# Patient Record
Sex: Female | Born: 1970 | Race: White | Hispanic: No | Marital: Married | Smoking: Never smoker
Health system: Southern US, Community
[De-identification: ages and names within clinical notes are randomized; demographics above are authoritative.]

## PROBLEM LIST (undated history)

## (undated) DIAGNOSIS — E079 Disorder of thyroid, unspecified: Secondary | ICD-10-CM

## (undated) DIAGNOSIS — N950 Postmenopausal bleeding: Secondary | ICD-10-CM

## (undated) DIAGNOSIS — E039 Hypothyroidism, unspecified: Secondary | ICD-10-CM

## (undated) DIAGNOSIS — E282 Polycystic ovarian syndrome: Secondary | ICD-10-CM

## (undated) DIAGNOSIS — F419 Anxiety disorder, unspecified: Secondary | ICD-10-CM

## (undated) DIAGNOSIS — T7840XA Allergy, unspecified, initial encounter: Secondary | ICD-10-CM

## (undated) DIAGNOSIS — Z5189 Encounter for other specified aftercare: Secondary | ICD-10-CM

## (undated) DIAGNOSIS — Z973 Presence of spectacles and contact lenses: Secondary | ICD-10-CM

## (undated) DIAGNOSIS — K589 Irritable bowel syndrome without diarrhea: Secondary | ICD-10-CM

## (undated) DIAGNOSIS — N840 Polyp of corpus uteri: Secondary | ICD-10-CM

## (undated) DIAGNOSIS — M1990 Unspecified osteoarthritis, unspecified site: Secondary | ICD-10-CM

## (undated) HISTORY — DX: Anxiety disorder, unspecified: F41.9

## (undated) HISTORY — PX: WISDOM TOOTH EXTRACTION: SHX21

## (undated) HISTORY — DX: Disorder of thyroid, unspecified: E07.9

## (undated) HISTORY — DX: Irritable bowel syndrome, unspecified: K58.9

## (undated) HISTORY — DX: Allergy, unspecified, initial encounter: T78.40XA

## (undated) HISTORY — DX: Encounter for other specified aftercare: Z51.89

## (undated) HISTORY — PX: LIPOMA EXCISION: SHX5283

---

## 2002-05-15 ENCOUNTER — Other Ambulatory Visit: Admission: RE | Admit: 2002-05-15 | Discharge: 2002-05-15 | Payer: Self-pay

## 2002-05-15 ENCOUNTER — Other Ambulatory Visit: Admission: RE | Admit: 2002-05-15 | Discharge: 2002-05-15 | Payer: Self-pay | Admitting: Unknown Physician Specialty

## 2003-11-21 DIAGNOSIS — Z5189 Encounter for other specified aftercare: Secondary | ICD-10-CM

## 2003-11-21 HISTORY — PX: DILATION AND CURETTAGE OF UTERUS: SHX78

## 2003-11-21 HISTORY — DX: Encounter for other specified aftercare: Z51.89

## 2007-01-22 ENCOUNTER — Ambulatory Visit: Payer: Self-pay | Admitting: Internal Medicine

## 2007-02-04 ENCOUNTER — Ambulatory Visit: Payer: Self-pay | Admitting: Internal Medicine

## 2007-02-04 HISTORY — PX: COLONOSCOPY: SHX174

## 2014-06-15 ENCOUNTER — Encounter: Payer: Self-pay | Admitting: Internal Medicine

## 2015-09-27 ENCOUNTER — Other Ambulatory Visit: Payer: Self-pay

## 2015-09-27 DIAGNOSIS — Z1231 Encounter for screening mammogram for malignant neoplasm of breast: Secondary | ICD-10-CM

## 2015-11-01 ENCOUNTER — Ambulatory Visit: Payer: Self-pay

## 2017-01-09 ENCOUNTER — Other Ambulatory Visit: Payer: Self-pay | Admitting: Obstetrics and Gynecology

## 2017-01-09 DIAGNOSIS — R928 Other abnormal and inconclusive findings on diagnostic imaging of breast: Secondary | ICD-10-CM

## 2017-01-12 ENCOUNTER — Ambulatory Visit
Admission: RE | Admit: 2017-01-12 | Discharge: 2017-01-12 | Disposition: A | Discharge status: Home / Self Care | Payer: BLUE CROSS/BLUE SHIELD | Source: Ambulatory Visit | Attending: Obstetrics and Gynecology | Admitting: Obstetrics and Gynecology

## 2017-01-12 ENCOUNTER — Other Ambulatory Visit: Payer: Self-pay | Admitting: Obstetrics and Gynecology

## 2017-01-12 DIAGNOSIS — R928 Other abnormal and inconclusive findings on diagnostic imaging of breast: Secondary | ICD-10-CM

## 2017-01-12 DIAGNOSIS — N6310 Unspecified lump in the right breast, unspecified quadrant: Secondary | ICD-10-CM

## 2017-01-15 ENCOUNTER — Ambulatory Visit
Admission: RE | Admit: 2017-01-15 | Discharge: 2017-01-15 | Disposition: A | Discharge status: Home / Self Care | Payer: BLUE CROSS/BLUE SHIELD | Source: Ambulatory Visit | Attending: Obstetrics and Gynecology | Admitting: Obstetrics and Gynecology

## 2017-01-15 ENCOUNTER — Other Ambulatory Visit: Payer: Self-pay | Admitting: Obstetrics and Gynecology

## 2017-01-15 DIAGNOSIS — N6310 Unspecified lump in the right breast, unspecified quadrant: Secondary | ICD-10-CM

## 2018-04-05 IMAGING — US ULTRASOUND RIGHT BREAST LIMITED
1 series · 7 of 7 positions shown · non-contrast
Comparison: Previous exam(s).

CLINICAL DATA: Right breast upper outer quadrant nodule seen on
most recent screening mammography.

EXAM:
2D DIGITAL DIAGNOSTIC RIGHT MAMMOGRAM WITH CAD AND ADJUNCT TOMO
ULTRASOUND RIGHT BREAST

[Series 1: ultrasound right breast limited · 0.06mm/px · 7 of 7 slices shown]
[im 1/7]
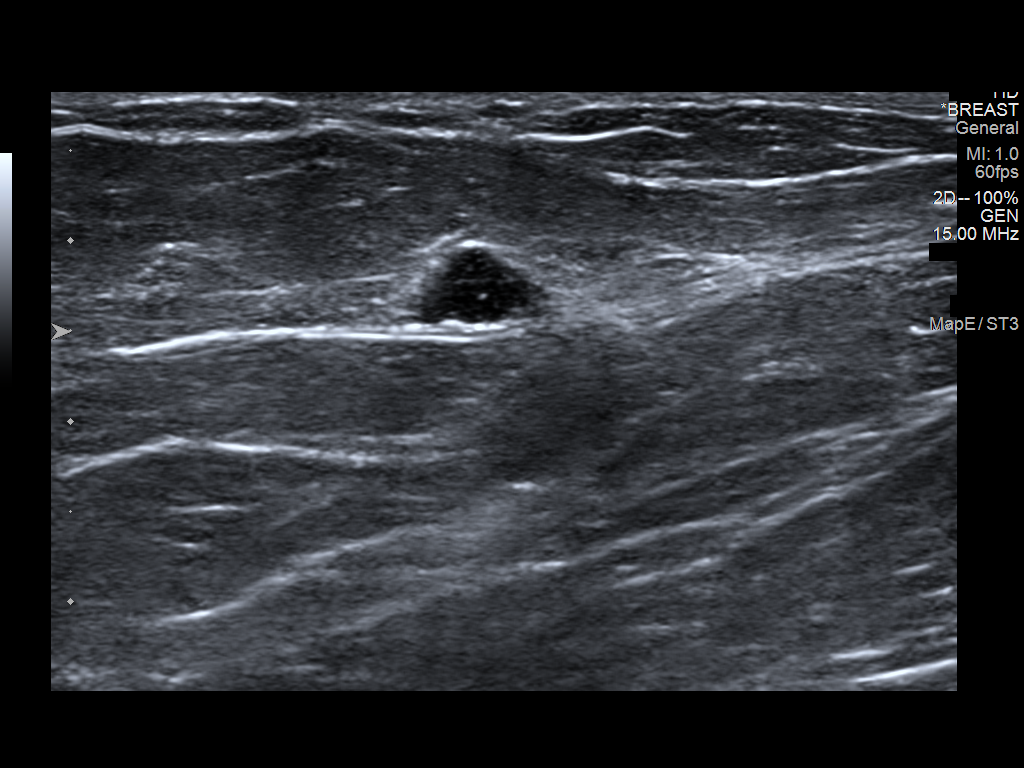
[im 2/7]
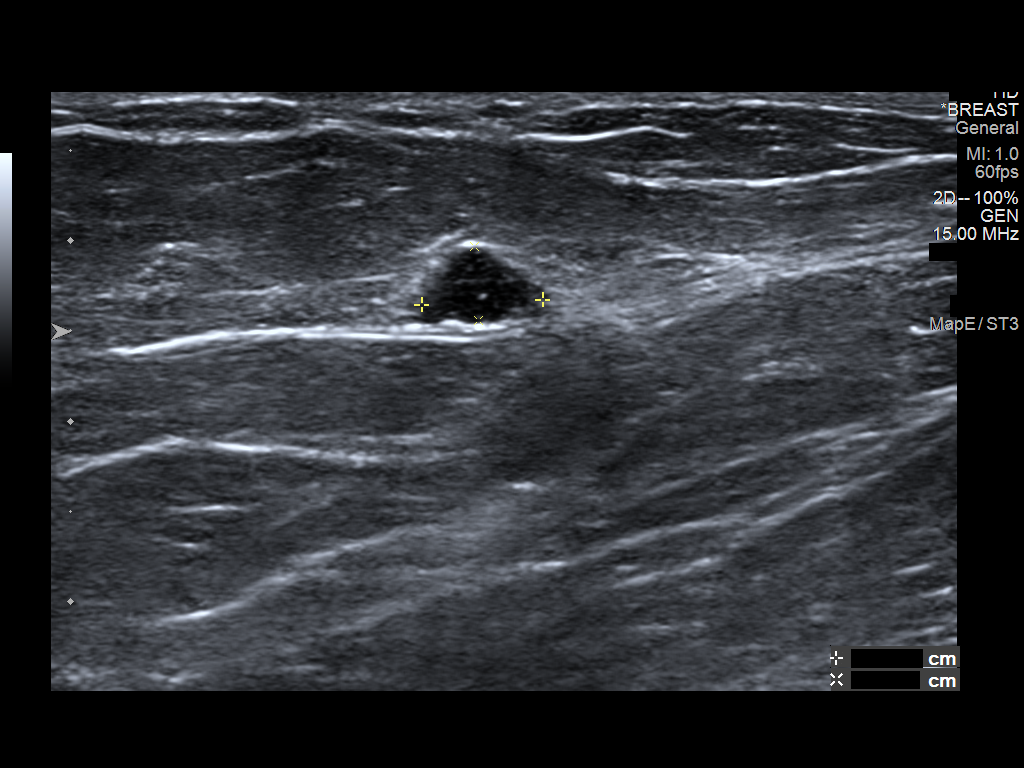
[im 3/7]
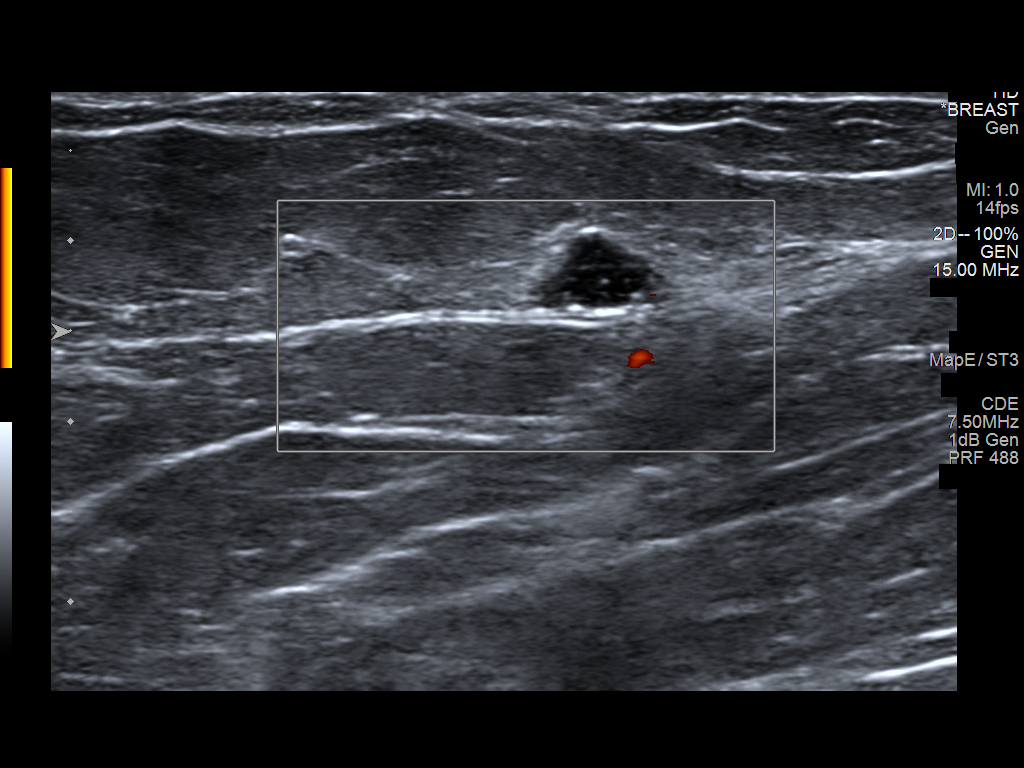
[im 4/7]
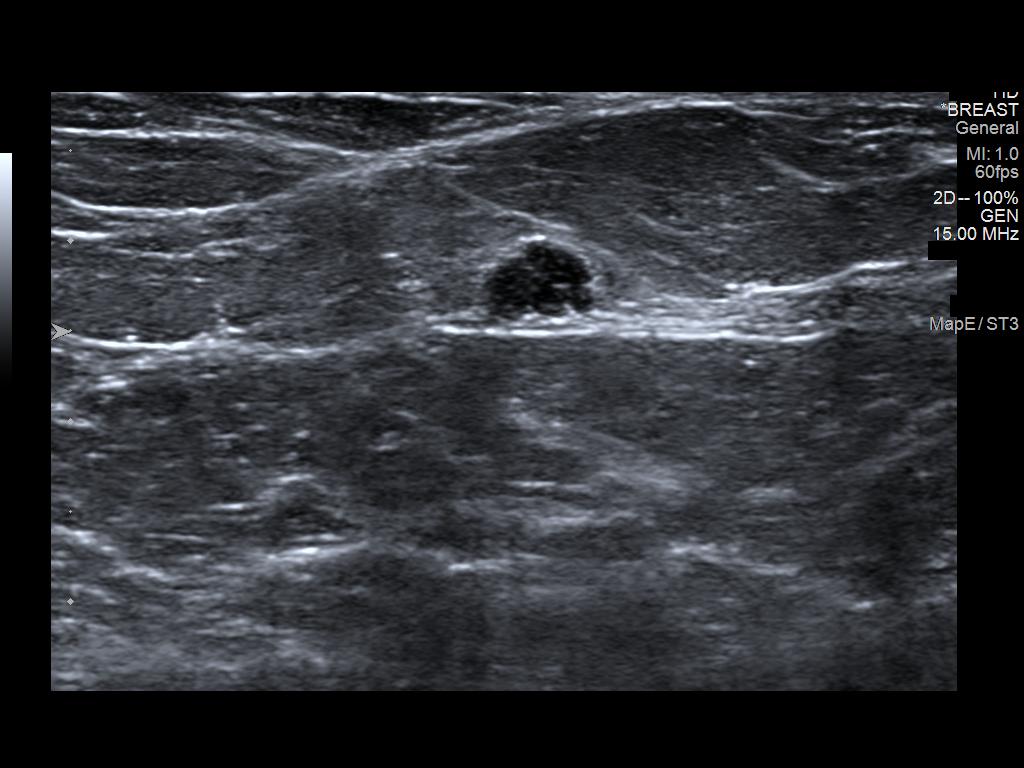
[im 5/7]
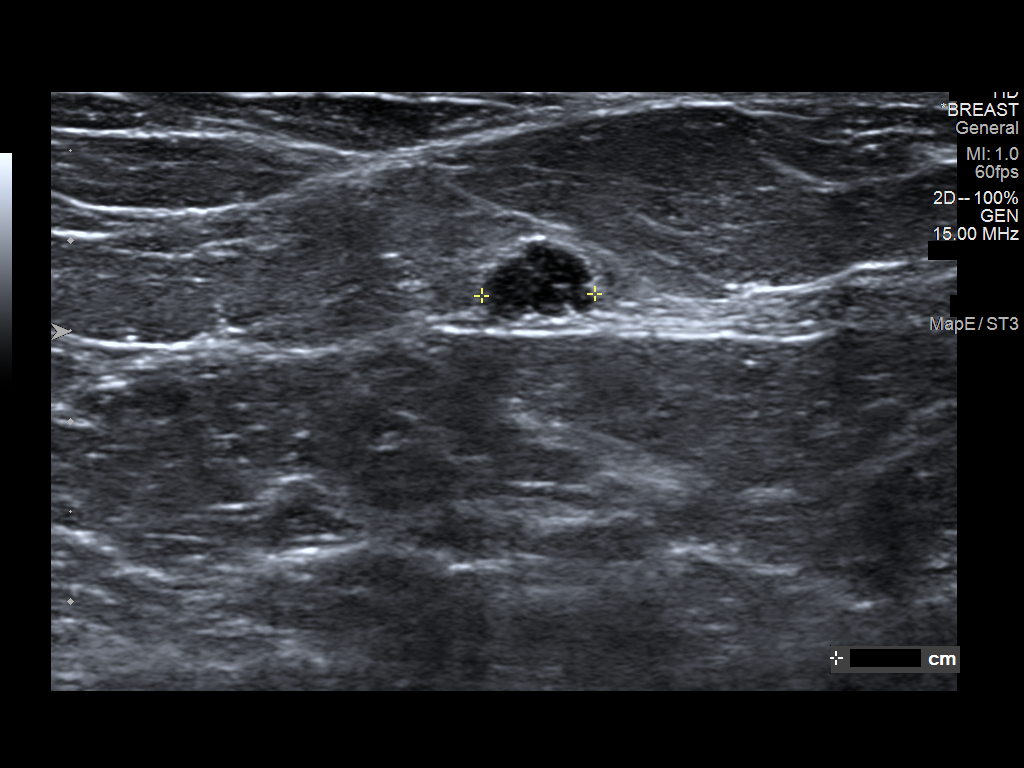
[im 6/7]
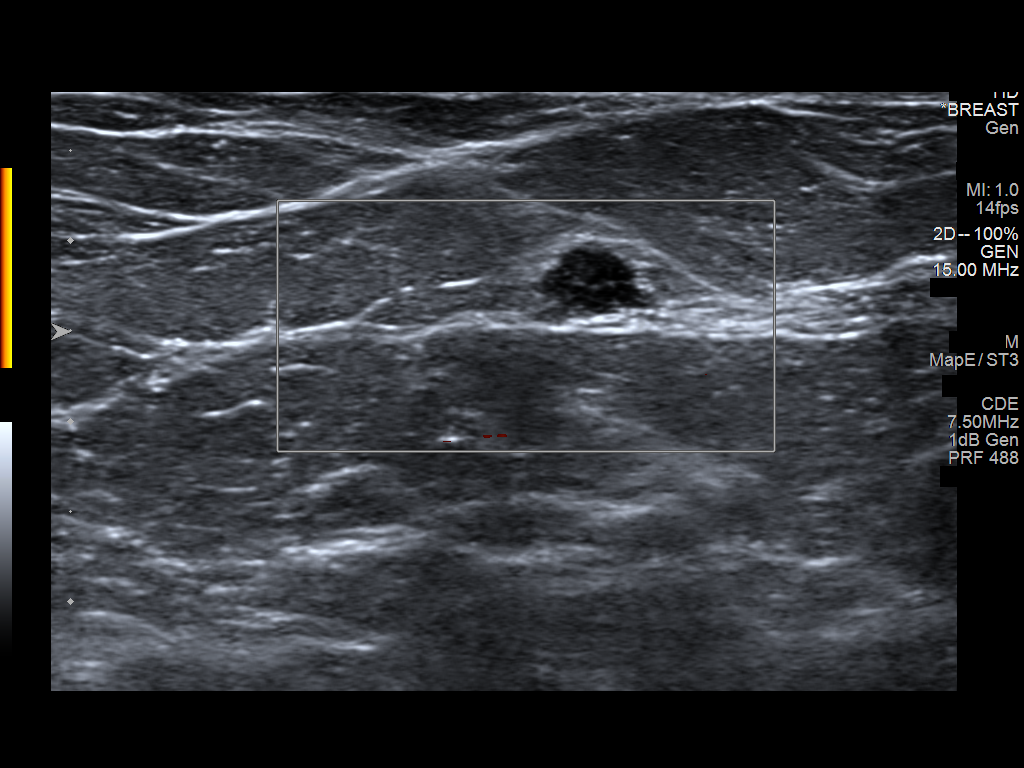
[im 7/7]
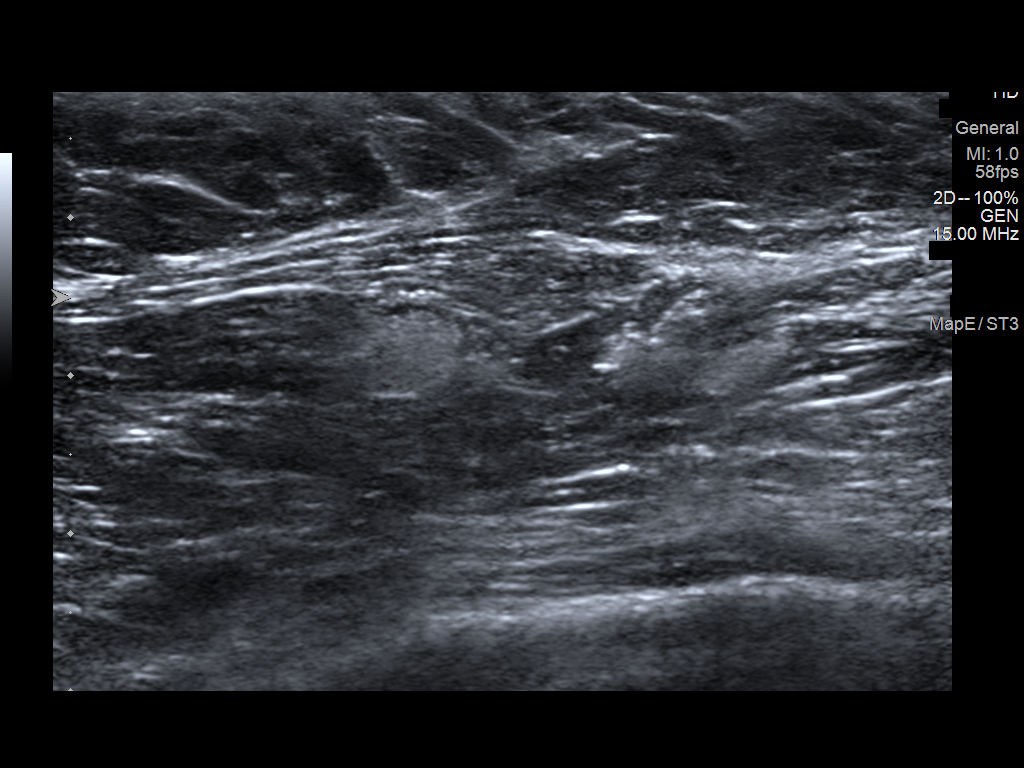

[7 of 7 positions shown; findings below may reference images not displayed]

ACR Breast Density Category b: There are scattered areas of
fibroglandular density.
FINDINGS: Additional mammographic views of the right breast demonstrate
persistent gently lobulated circumscribed isodense to breast
parenchyma 9 mm mass in the right breast upper outer quadrant,
anterior depth.

Mammographic images were processed with CAD.

On physical exam, no areas of palpable concern.

Targeted ultrasound is performed, showing right breast 10 o'clock 3
cm from the nipple hypoechoic lobulated mostly circumscribed mass
which measures 0.7 x 0.4 x 0.6 cm. This finding likely corresponds
to the mammographically seen nodule.
IMPRESSION: Indeterminate right breast 10 o'clock nodule, for which
ultrasound-guided core needle biopsy is recommended.

RECOMMENDATION:
Ultrasound-guided core needle biopsy of the right breast.

I have discussed the findings and recommendations with the patient.
Results were also provided in writing at the conclusion of the
visit. If applicable, a reminder letter will be sent to the patient
regarding the next appointment.

BI-RADS CATEGORY  4: Suspicious.

## 2018-04-08 IMAGING — MG MM CLIP PLACEMENT
2 series · 2 of 2 positions shown · non-contrast
Comparison: Previous exam(s).

CLINICAL DATA: Status post ultrasound-guided right breast biopsy

EXAM:
DIAGNOSTIC RIGHT MAMMOGRAM POST ULTRASOUND BIOPSY

[R ML]
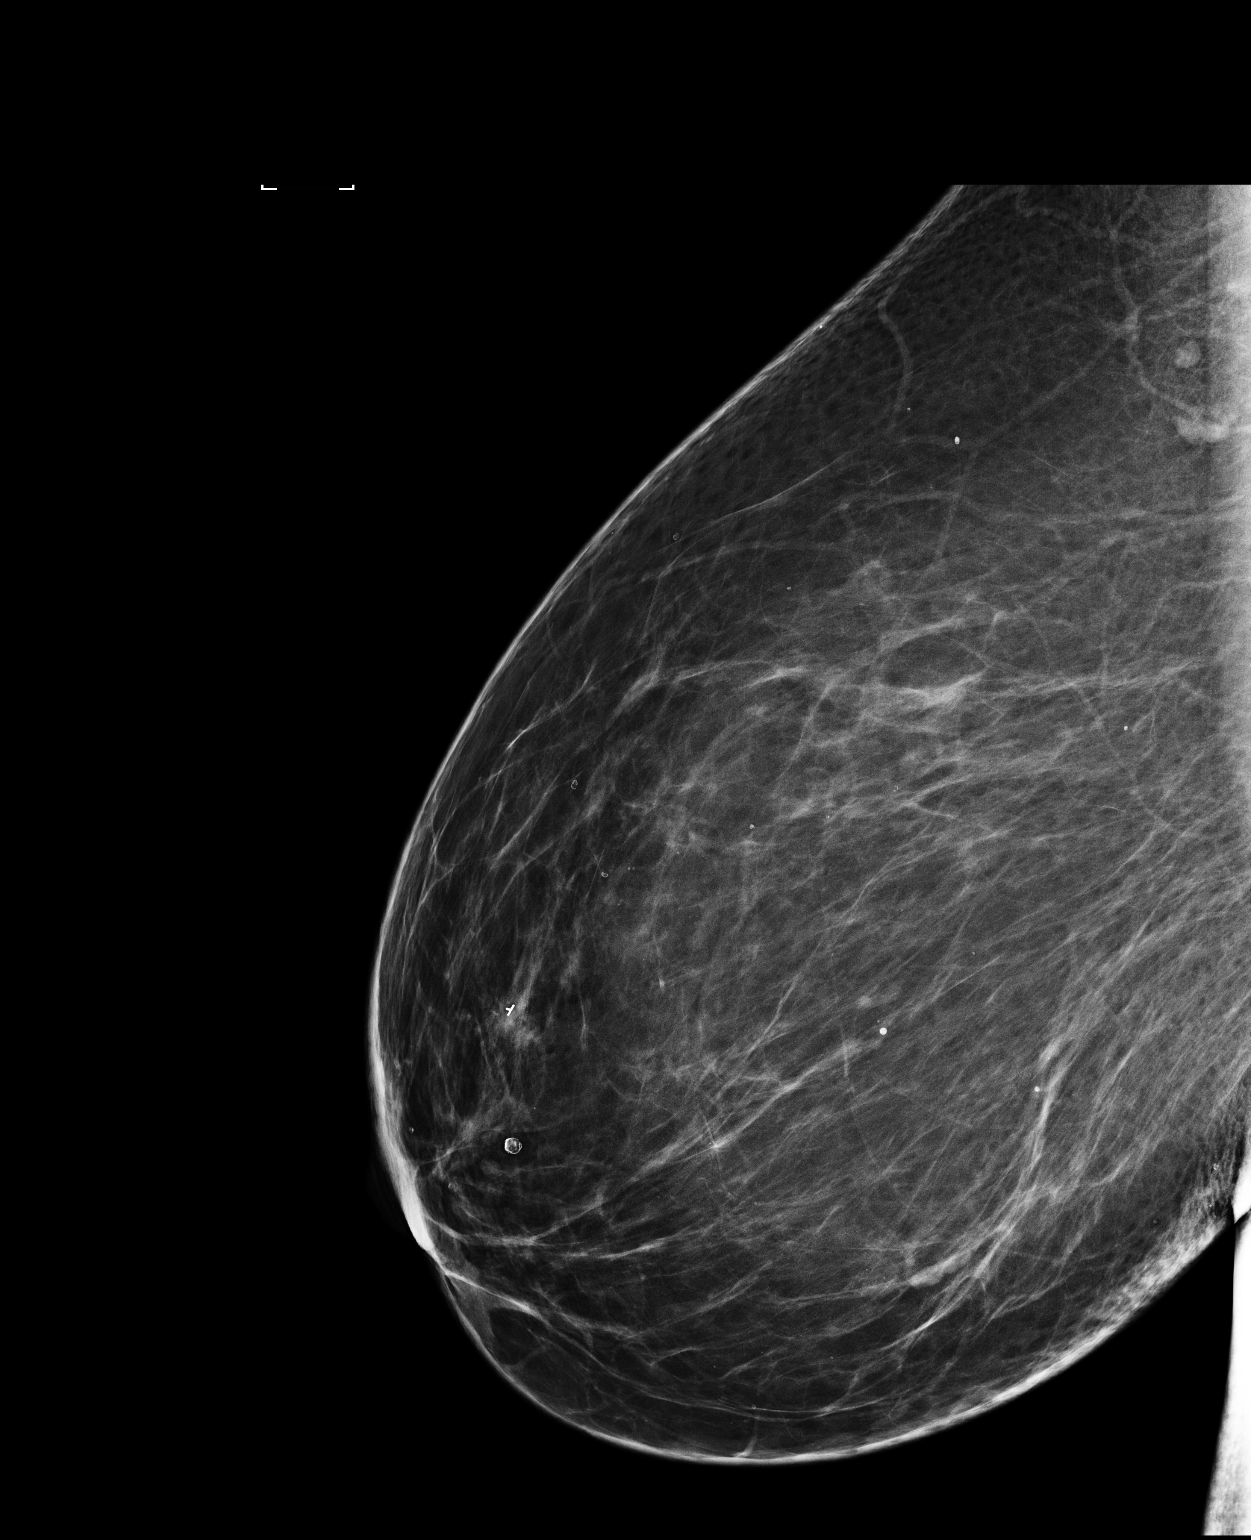

[R CC]
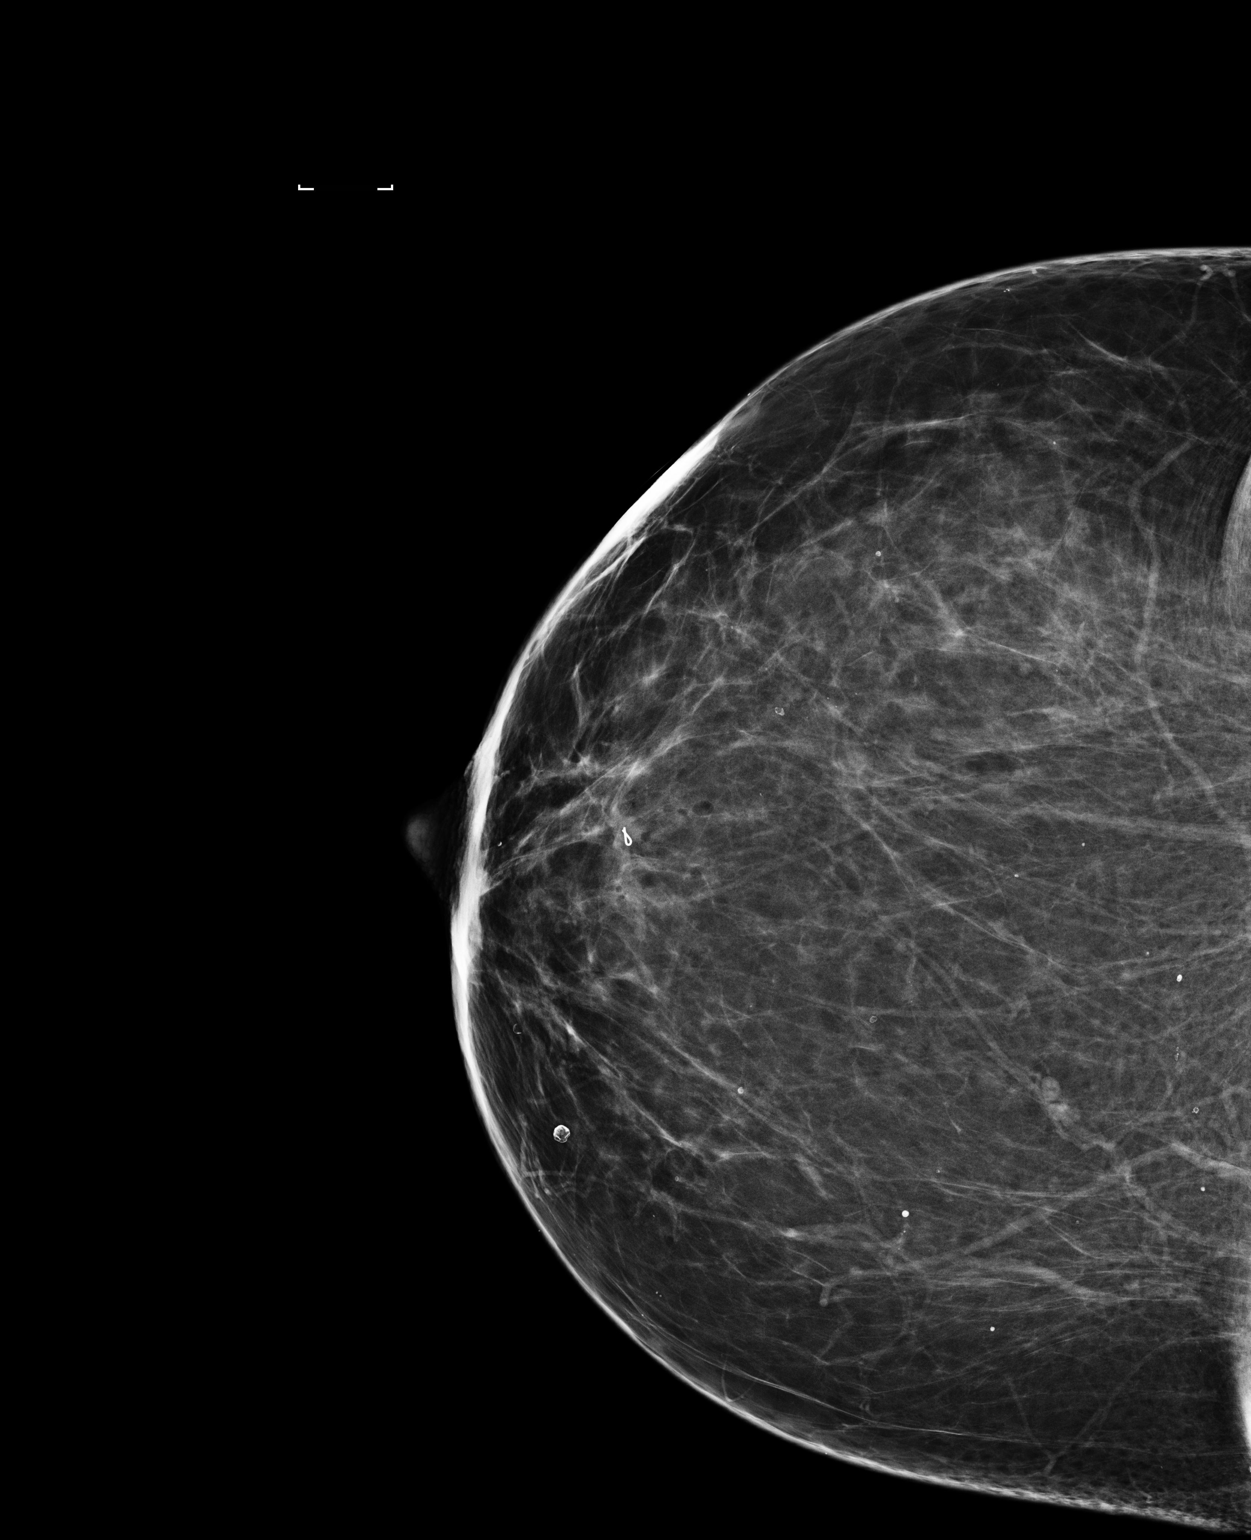

[2 of 2 positions shown; findings below may reference images not displayed]

FINDINGS: Mammographic images were obtained following ultrasound guided biopsy
of an indeterminate right breast mass at 10 o'clock, 3 cm from the
nipple. Post biopsy mammogram demonstrates the ribbon shaped biopsy
marker in the expected location within the upper, slightly outer
right breast.
IMPRESSION: Appropriate marker position as above.

Final Assessment: Post Procedure Mammograms for Marker Placement

## 2020-08-11 ENCOUNTER — Encounter: Payer: Self-pay | Admitting: Gastroenterology

## 2020-10-08 ENCOUNTER — Other Ambulatory Visit: Payer: Self-pay

## 2020-10-08 ENCOUNTER — Ambulatory Visit (AMBULATORY_SURGERY_CENTER): Payer: Self-pay | Admitting: *Deleted

## 2020-10-08 VITALS — Ht 69.25 in | Wt 322.0 lb

## 2020-10-08 DIAGNOSIS — Z1211 Encounter for screening for malignant neoplasm of colon: Secondary | ICD-10-CM

## 2020-10-08 NOTE — Progress Notes (Signed)
Fully vax'd  No egg or soy allergy known to patient  No issues with past sedation with any surgeries or procedures no intubation problems in the past  No FH of Malignant Hyperthermia No diet pills per patient No home 02 use per patient  No blood thinners per patient  Pt denies issues with constipation  But states varies with looser stools- has IBS  No A fib or A flutter  EMMI video to pt or via [place] [facility] guidelines implemented in PV today with Pt and RN     Due to the COVID-19 pandemic we are asking patients to follow these guidelines. Please only bring one care partner. Please be aware that your care partner may wait in the car in the parking lot or if they feel like they will be too hot to wait in the car, they may wait in the lobby on the 4th floor. All care partners are required to wear a mask the entire time (we do not have any that we can provide them), they need to practice social distancing, and we will do a Covid check for all patient's and care partners when you arrive. Also we will check their temperature and your temperature. If the care partner waits in their car they need to stay in the parking lot the entire time and we will call them on their cell phone when the patient is ready for discharge so they can bring the car to the front of the building. Also all patient's will need to wear a mask into building.

## 2020-10-18 ENCOUNTER — Telehealth: Payer: Self-pay | Admitting: Internal Medicine

## 2020-10-18 DIAGNOSIS — Z1211 Encounter for screening for malignant neoplasm of colon: Secondary | ICD-10-CM

## 2020-10-18 MED ORDER — CLENPIQ 10-3.5-12 MG-GM -GM/160ML PO SOLN: 1.0000 | ORAL | 0 refills | Status: DC

## 2020-10-18 NOTE — Telephone Encounter (Signed)
Explained the different preps we use. Patient thinks she will be able to tolerate Clenpiq. RX sent to pt's pharmacy and new prep instructions sent to patient's email.  Pt is aware. Patient is also aware that she will need to drink all the clear liquids as instructed with this prep. Patient denies any kidney problems. Patient will print clenpiq coupon and take with her to the pharmacy.

## 2020-10-18 NOTE — Telephone Encounter (Signed)
Patient called stating that she thinks she will not be able to do the prep.  Wants to know if there is anything else that can be done to prepare for procedure or if she needs to cancel.  She is scheduled for procedure on 10/22/20.  Please call.

## 2020-10-22 ENCOUNTER — Encounter: Payer: BLUE CROSS/BLUE SHIELD | Admitting: Internal Medicine

## 2020-10-22 ENCOUNTER — Encounter: Payer: BLUE CROSS/BLUE SHIELD | Admitting: Gastroenterology

## 2020-12-06 ENCOUNTER — Ambulatory Visit (AMBULATORY_SURGERY_CENTER): Payer: BC Managed Care – PPO | Admitting: *Deleted

## 2020-12-06 ENCOUNTER — Other Ambulatory Visit: Payer: Self-pay

## 2020-12-06 VITALS — Ht 69.25 in | Wt 322.0 lb

## 2020-12-06 DIAGNOSIS — Z1211 Encounter for screening for malignant neoplasm of colon: Secondary | ICD-10-CM

## 2020-12-06 NOTE — Progress Notes (Signed)
Completed covid vaccines  Pt is aware that care partner will wait in the car during procedure; if they feel like they will be too hot or cold to wait in the car; they may wait in the 4 th floor lobby. Patient is aware to bring only one care partner. We want them to wear a mask (we do not have any that we can provide them), practice social distancing, and we will check their temperatures when they get here.  I did remind the patient that their care partner needs to stay in the parking lot the entire time and have a cell phone available, we will call them when the pt is ready for discharge. Patient will wear mask into building.  Pt's previsit is done over the phone and all paperwork (prep instructions, blank consent form to just read over, pre-procedure acknowledgement form and stamped envelope) sent to patient   No trouble with anesthesia, denies being told they were difficult to intubate, or hx/fam hx of malignant hyperthermia per pt   No egg or soy allergy  No home oxygen use   No medications for weight loss taken  Pt denies constipation issues Pt has Clenpiq at home

## 2021-01-07 ENCOUNTER — Encounter: Payer: BLUE CROSS/BLUE SHIELD | Admitting: Internal Medicine

## 2021-02-17 ENCOUNTER — Telehealth: Payer: Self-pay | Admitting: Internal Medicine

## 2021-02-17 NOTE — Telephone Encounter (Signed)
By all means okay

## 2021-02-17 NOTE — Telephone Encounter (Signed)
OK with me.

## 2021-02-17 NOTE — Telephone Encounter (Signed)
Hey Dr Carlean Purl, this former pt of yours is wishing to switch her care from you to Dr Tarri Glenn since she prefers to see a female provider, would the switch be ok?

## 2021-02-18 ENCOUNTER — Encounter: Payer: BC Managed Care – PPO | Admitting: Internal Medicine

## 2021-03-30 NOTE — H&P (Signed)
Linda Marsh is an 50 y.o. female. Presenting for surgical management of postmenopausal bleeding with a suspected polyp. She presenting with PMB. An SIS was performed which demonstrated a polyp. It was difficult s/s retroverted uterus. EMB was done which was benign. She has a history of obesity (BMI 48), anxiety (on buproprion and buspar), PCOS, and hypothyroidism (on levothyroxine). She has an allergy to latex (itching). She is a Marine scientist.   Pertinent Gynecological History: Menses: post-menopausal Bleeding: post menopausal bleeding Contraception: post menopausal status DES exposure: denies Blood transfusions: none Sexually transmitted diseases: no past history Previous GYN Procedures: n/a  Last mammogram: normal  Last pap: normal Date: 2021 OB History: G3, P2012 - SVD x 1, CS x 1  Menstrual History: Patient's last menstrual period was 12/11/2016.    Past Medical History:  Diagnosis Date  . Allergy   . Anxiety    situational   . Blood transfusion without reported diagnosis   . IBS (irritable bowel syndrome)   . Thyroid disease     Past Surgical History:  Procedure Laterality Date  . CESAREAN SECTION  1989  . COLONOSCOPY  02/04/2007  . DILATION AND CURETTAGE OF UTERUS     16 wek spontaneous abortion   . WISDOM TOOTH EXTRACTION      Family History  Problem Relation Age of Onset  . Colon polyps Father   . Colon cancer Neg Hx   . Esophageal cancer Neg Hx   . Rectal cancer Neg Hx   . Stomach cancer Neg Hx     Social History:  reports that she has never smoked. She has never used smokeless tobacco. She reports current alcohol use. She reports that she does not use drugs.  Allergies:  Allergies  Allergen Reactions  . Latex Itching    No medications prior to admission.    Review of Systems  Last menstrual period 12/11/2016. Physical Exam  Gen: well appearing, NAD CV: Reg rate Pulm: NWOB Abd: soft, nondistended, nontender, no masses, difficult s/s body  habitus GYN: uterus 12 week size, retroverted, no adnexa ttp/CMT Ext: No edema b/l   No results found for this or any previous visit (from the past 24 hour(s)).  No results found. SIS with an 9cm uterus with adenomyosis and retroverted. 44mm polyp.  Assessment/Plan: 50 yo G3P2012 (CS x1, SVD x1) presenting for management of postmenopausal bleeding in the setting of a known polyp on SIS. After counseling in the office, she elected to have a dilation, curretage, hysteroscopy, and polypectomy. Risks discussed including infection, bleeding, damage to surrounding structures, need for additional procedures, postoperative DVT, fluid overload, and unexpected pathology findings. All questions answered. Consent signed.     Tyson Dense 03/30/2021, 1:51 PM

## 2021-04-11 ENCOUNTER — Encounter (HOSPITAL_BASED_OUTPATIENT_CLINIC_OR_DEPARTMENT_OTHER): Payer: Self-pay | Admitting: Obstetrics and Gynecology

## 2021-04-11 ENCOUNTER — Other Ambulatory Visit: Payer: Self-pay

## 2021-04-11 NOTE — Progress Notes (Addendum)
Spoke w/ via phone for pre-op interview---pt Lab needs dos----   T & S urine preg            Lab results------none COVID test -----patient states asymptomatic no test needed Arrive at -------530 am 04-15-2021 NPO after MN NO Solid Food.  Clear liquids from MN until---430 am then npo Med rec completed Medications to take morning of surgery ---bupropion, buspirone prn, levothyroxine-- Diabetic medication -----n/aPatient instructed to bring photo id and insurance card day of surgery Patient aware to have Driver (ride ) / caregiver  Spouse edwin Montanari will stay   for 24 hours after surgery  Patient Special Instructions -----none Pre-Op special Istructions -----none Patient verbalized understanding of instructions that were given at this phone interview. Patient denies shortness of breath, chest pain, fever, cough at this phone interview.  Addendum: reviewed pt history and bmi 48.66 with dr Viviano Simas mda, pt meets [facility] guidelines barring any acute statuts changes per dr Viviano Simas mda.

## 2021-04-15 ENCOUNTER — Ambulatory Visit (HOSPITAL_BASED_OUTPATIENT_CLINIC_OR_DEPARTMENT_OTHER): Payer: BC Managed Care – PPO | Admitting: Certified Registered"

## 2021-04-15 ENCOUNTER — Ambulatory Visit (HOSPITAL_BASED_OUTPATIENT_CLINIC_OR_DEPARTMENT_OTHER)
Admission: RE | Admit: 2021-04-15 | Discharge: 2021-04-15 | Disposition: A | Discharge status: Home / Self Care | Payer: BC Managed Care – PPO | Attending: Obstetrics and Gynecology | Admitting: Obstetrics and Gynecology

## 2021-04-15 ENCOUNTER — Other Ambulatory Visit: Payer: Self-pay

## 2021-04-15 ENCOUNTER — Encounter (HOSPITAL_BASED_OUTPATIENT_CLINIC_OR_DEPARTMENT_OTHER): Payer: Self-pay | Admitting: Obstetrics and Gynecology

## 2021-04-15 ENCOUNTER — Encounter (HOSPITAL_BASED_OUTPATIENT_CLINIC_OR_DEPARTMENT_OTHER)
Admission: RE | Disposition: A | Discharge status: Home / Self Care | Payer: Self-pay | Source: Home / Self Care | Attending: Obstetrics and Gynecology

## 2021-04-15 DIAGNOSIS — Z79890 Hormone replacement therapy (postmenopausal): Secondary | ICD-10-CM | POA: Diagnosis not present

## 2021-04-15 DIAGNOSIS — F419 Anxiety disorder, unspecified: Secondary | ICD-10-CM | POA: Insufficient documentation

## 2021-04-15 DIAGNOSIS — Z91040 Latex allergy status: Secondary | ICD-10-CM | POA: Diagnosis not present

## 2021-04-15 DIAGNOSIS — N840 Polyp of corpus uteri: Secondary | ICD-10-CM | POA: Diagnosis not present

## 2021-04-15 DIAGNOSIS — D250 Submucous leiomyoma of uterus: Secondary | ICD-10-CM | POA: Diagnosis not present

## 2021-04-15 DIAGNOSIS — N950 Postmenopausal bleeding: Secondary | ICD-10-CM | POA: Diagnosis present

## 2021-04-15 DIAGNOSIS — E282 Polycystic ovarian syndrome: Secondary | ICD-10-CM | POA: Diagnosis not present

## 2021-04-15 DIAGNOSIS — E039 Hypothyroidism, unspecified: Secondary | ICD-10-CM | POA: Diagnosis not present

## 2021-04-15 DIAGNOSIS — Z6841 Body Mass Index (BMI) 40.0 and over, adult: Secondary | ICD-10-CM | POA: Diagnosis not present

## 2021-04-15 HISTORY — DX: Postmenopausal bleeding: N95.0

## 2021-04-15 HISTORY — PX: DILATATION & CURETTAGE/HYSTEROSCOPY WITH MYOSURE: SHX6511

## 2021-04-15 HISTORY — DX: Polyp of corpus uteri: N84.0

## 2021-04-15 HISTORY — DX: Hypothyroidism, unspecified: E03.9

## 2021-04-15 HISTORY — DX: Polycystic ovarian syndrome: E28.2

## 2021-04-15 HISTORY — DX: Presence of spectacles and contact lenses: Z97.3

## 2021-04-15 HISTORY — DX: Unspecified osteoarthritis, unspecified site: M19.90

## 2021-04-15 LAB — TYPE AND SCREEN
ABO/RH(D): A POS
Antibody Screen: NEGATIVE

## 2021-04-15 LAB — POCT PREGNANCY, URINE: Preg Test, Ur: NEGATIVE

## 2021-04-15 SURGERY — DILATATION & CURETTAGE/HYSTEROSCOPY WITH MYOSURE
Anesthesia: General | Site: Uterus

## 2021-04-15 MED ORDER — DEXAMETHASONE SODIUM PHOSPHATE 10 MG/ML IJ SOLN
INTRAMUSCULAR | Status: AC
  Filled 2021-04-15: qty 1

## 2021-04-15 MED ORDER — PROPOFOL 10 MG/ML IV BOLUS
INTRAVENOUS | Status: DC | PRN
  Administered 2021-04-15: 200 mg via INTRAVENOUS

## 2021-04-15 MED ORDER — FENTANYL CITRATE (PF) 100 MCG/2ML IJ SOLN
INTRAMUSCULAR | Status: AC
  Filled 2021-04-15: qty 2

## 2021-04-15 MED ORDER — LIDOCAINE 2% (20 MG/ML) 5 ML SYRINGE
INTRAMUSCULAR | Status: AC
  Filled 2021-04-15: qty 5

## 2021-04-15 MED ORDER — ONDANSETRON HCL 4 MG/2ML IJ SOLN: 4.0000 mg | Freq: Once | INTRAMUSCULAR | Status: DC | PRN

## 2021-04-15 MED ORDER — DEXAMETHASONE SODIUM PHOSPHATE 10 MG/ML IJ SOLN
INTRAMUSCULAR | Status: DC | PRN
  Administered 2021-04-15: 5 mg via INTRAVENOUS

## 2021-04-15 MED ORDER — POVIDONE-IODINE 10 % EX SWAB: 2.0000 "application " | Freq: Once | CUTANEOUS | Status: DC

## 2021-04-15 MED ORDER — LIDOCAINE HCL 1 % IJ SOLN
INTRAMUSCULAR | Status: DC | PRN
  Administered 2021-04-15: 10 mL

## 2021-04-15 MED ORDER — FENTANYL CITRATE (PF) 100 MCG/2ML IJ SOLN
INTRAMUSCULAR | Status: DC | PRN
  Administered 2021-04-15 (×2): 50 ug via INTRAVENOUS

## 2021-04-15 MED ORDER — LACTATED RINGERS IV SOLN: INTRAVENOUS | Status: DC

## 2021-04-15 MED ORDER — KETOROLAC TROMETHAMINE 30 MG/ML IJ SOLN
INTRAMUSCULAR | Status: AC
  Filled 2021-04-15: qty 1

## 2021-04-15 MED ORDER — KETOROLAC TROMETHAMINE 30 MG/ML IJ SOLN: 30.0000 mg | Freq: Once | INTRAMUSCULAR | Status: DC | PRN

## 2021-04-15 MED ORDER — MIDAZOLAM HCL 2 MG/2ML IJ SOLN
INTRAMUSCULAR | Status: AC
  Filled 2021-04-15: qty 2

## 2021-04-15 MED ORDER — FENTANYL CITRATE (PF) 100 MCG/2ML IJ SOLN: 25.0000 ug | INTRAMUSCULAR | Status: DC | PRN

## 2021-04-15 MED ORDER — MIDAZOLAM HCL 2 MG/2ML IJ SOLN
INTRAMUSCULAR | Status: DC | PRN
  Administered 2021-04-15: 2 mg via INTRAVENOUS

## 2021-04-15 MED ORDER — LIDOCAINE 2% (20 MG/ML) 5 ML SYRINGE
INTRAMUSCULAR | Status: DC | PRN
  Administered 2021-04-15: 100 mg via INTRAVENOUS

## 2021-04-15 MED ORDER — PROPOFOL 10 MG/ML IV BOLUS
INTRAVENOUS | Status: AC
  Filled 2021-04-15: qty 20

## 2021-04-15 MED ORDER — WHITE PETROLATUM EX OINT
TOPICAL_OINTMENT | CUTANEOUS | Status: AC
  Filled 2021-04-15: qty 5

## 2021-04-15 MED ORDER — DROPERIDOL 2.5 MG/ML IJ SOLN
INTRAMUSCULAR | Status: DC | PRN
  Administered 2021-04-15: .625 mg via INTRAVENOUS

## 2021-04-15 MED ORDER — ONDANSETRON HCL 4 MG/2ML IJ SOLN
INTRAMUSCULAR | Status: DC | PRN
  Administered 2021-04-15: 4 mg via INTRAVENOUS

## 2021-04-15 MED ORDER — ONDANSETRON HCL 4 MG/2ML IJ SOLN
INTRAMUSCULAR | Status: AC
  Filled 2021-04-15: qty 2

## 2021-04-15 MED ORDER — KETOROLAC TROMETHAMINE 30 MG/ML IJ SOLN
INTRAMUSCULAR | Status: DC | PRN
  Administered 2021-04-15: 30 mg via INTRAVENOUS

## 2021-04-15 MED ORDER — OXYCODONE HCL 5 MG PO TABS: 5.0000 mg | ORAL_TABLET | Freq: Once | ORAL | Status: DC | PRN

## 2021-04-15 MED ORDER — OXYCODONE HCL 5 MG/5ML PO SOLN: 5.0000 mg | Freq: Once | ORAL | Status: DC | PRN

## 2021-04-15 MED ORDER — SODIUM CHLORIDE 0.9 % IR SOLN
Status: DC | PRN
  Administered 2021-04-15: 3000 mL

## 2021-04-15 SURGICAL SUPPLY — 21 items
CATH ROBINSON RED A/P 16FR (CATHETERS) ×2 IMPLANT
COVER WAND RF STERILE (DRAPES) ×2 IMPLANT
DEVICE MYOSURE LITE (MISCELLANEOUS) IMPLANT
DEVICE MYOSURE REACH (MISCELLANEOUS) ×2 IMPLANT
DILATOR CANAL MILEX (MISCELLANEOUS) IMPLANT
GAUZE 4X4 16PLY RFD (DISPOSABLE) IMPLANT
GLOVE SURG ENC MOIS LTX SZ6.5 (GLOVE) ×2 IMPLANT
GLOVE SURG UNDER POLY LF SZ6.5 (GLOVE) ×2 IMPLANT
GLOVE SURG UNDER POLY LF SZ7 (GLOVE) ×2 IMPLANT
GOWN STRL REUS W/TWL LRG LVL3 (GOWN DISPOSABLE) ×4 IMPLANT
IV NS IRRIG 3000ML ARTHROMATIC (IV SOLUTION) ×2 IMPLANT
KIT PROCEDURE FLUENT (KITS) ×2 IMPLANT
KIT TURNOVER CYSTO (KITS) ×2 IMPLANT
MYOSURE XL FIBROID REM (MISCELLANEOUS)
NS IRRIG 500ML POUR BTL (IV SOLUTION) IMPLANT
PACK VAGINAL MINOR WOMEN LF (CUSTOM PROCEDURE TRAY) ×2 IMPLANT
PAD OB MATERNITY 4.3X12.25 (PERSONAL CARE ITEMS) ×2 IMPLANT
SEAL CERVICAL OMNI LOK (ABLATOR) IMPLANT
SEAL ROD LENS SCOPE MYOSURE (ABLATOR) ×2 IMPLANT
SYSTEM TISS REMOVAL MYSR XL RM (MISCELLANEOUS) IMPLANT
WATER STERILE IRR 500ML POUR (IV SOLUTION) ×2 IMPLANT

## 2021-04-15 NOTE — Anesthesia Procedure Notes (Signed)
Procedure Name: LMA Insertion Date/Time: 04/15/2021 7:37 AM Performed by: Suan Halter, CRNA Pre-anesthesia Checklist: Patient identified, Emergency Drugs available, Suction available and Patient being monitored Patient Re-evaluated:Patient Re-evaluated prior to induction Oxygen Delivery Method: Circle system utilized Preoxygenation: Pre-oxygenation with 100% oxygen Induction Type: IV induction Ventilation: Mask ventilation without difficulty LMA: LMA inserted LMA Size: 4.0 Number of attempts: 1 Airway Equipment and Method: Bite block Placement Confirmation: positive ETCO2 Tube secured with: Tape Dental Injury: Teeth and Oropharynx as per pre-operative assessment

## 2021-04-15 NOTE — Transfer of Care (Signed)
Immediate Anesthesia Transfer of Care Note  Patient: Linda Marsh  Procedure(s) Performed: Procedure(s) (LRB): DILATATION & CURETTAGE/HYSTEROSCOPY WITH POSSIBLE MYOSURE (N/A)  Patient Location: PACU  Anesthesia Type: General  Level of Consciousness: awake, oriented, sedated and patient cooperative  Airway & Oxygen Therapy: Patient Spontanous Breathing and Patient connected to face mask oxygen  Post-op Assessment: Report given to PACU RN and Post -op Vital signs reviewed and stable  Post vital signs: Reviewed and stable  Complications: No apparent anesthesia complications Last Vitals:  Vitals Value Taken Time  BP 132/75 04/15/21 0815  Temp 36.4 C 04/15/21 0811  Pulse 72 04/15/21 0827  Resp 12 04/15/21 0827  SpO2 96 % 04/15/21 0827  Vitals shown include unvalidated device data.  Last Pain:  Vitals:   04/15/21 0815  TempSrc:   PainSc: Asleep      Patients Stated Pain Goal: 4 (26/83/41 9622)  Complications: No complications documented.

## 2021-04-15 NOTE — Discharge Instructions (Signed)
  Post Anesthesia Home Care Instructions  Activity: Get plenty of rest for the remainder of the day. A responsible individual must stay with you for 24 hours following the procedure.  For the next 24 hours, DO NOT: -Drive a car -Paediatric nurse -Drink alcoholic beverages -Take any medication unless instructed by your physician -Make any legal decisions or sign important papers.  Meals: Start with liquid foods such as gelatin or soup. Progress to regular foods as tolerated. Avoid greasy, spicy, heavy foods. If nausea and/or vomiting occur, drink only clear liquids until the nausea and/or vomiting subsides. Call your physician if vomiting continues.  Special Instructions/Symptoms: Your throat may feel dry or sore from the anesthesia or the breathing tube placed in your throat during surgery. If this causes discomfort, gargle with warm salt water. The discomfort should disappear within 24 hours.  If you had a scopolamine patch placed behind your ear for the management of post- operative nausea and/or vomiting:  1. The medication in the patch is effective for 72 hours, after which it should be removed.  Wrap patch in a tissue and discard in the trash. Wash hands thoroughly with soap and water. 2. You may remove the patch earlier than 72 hours if you experience unpleasant side effects which may include dry mouth, dizziness or visual disturbances. 3. Avoid touching the patch. Wash your hands with soap and water after contact with the patch.    DISCHARGE INSTRUCTIONS: D&C / D&E The following instructions have been prepared to help you care for yourself upon your return home.   Personal hygiene: Marland Kitchen Use sanitary pads for vaginal drainage, not tampons. . Shower the day after your procedure. . NO tub baths, pools or Jacuzzis for 2-3 weeks. . Wipe front to back after using the bathroom.  Activity and limitations: . Do NOT drive or operate any equipment for 24 hours. The effects of anesthesia  are still present and drowsiness may result. . Do NOT rest in bed all day. . Walking is encouraged. . Walk up and down stairs slowly. . You may resume your normal activity in one to two days or as indicated by your physician.  Sexual activity: NO intercourse for at least 2 weeks after the procedure, or as indicated by your physician.  Diet: Eat a light meal as desired this evening. You may resume your usual diet tomorrow.  Return to work: You may resume your work activities in one to two days or as indicated by your doctor.  What to expect after your surgery: Expect to have vaginal bleeding/discharge for 2-3 days and spotting for up to 10 days. It is not unusual to have soreness for up to 1-2 weeks. You may have a slight burning sensation when you urinate for the first day. Mild cramps may continue for a couple of days. You may have a regular period in 2-6 weeks.  Call your doctor for any of the following: . Excessive vaginal bleeding, saturating and changing one pad every hour. . Inability to urinate 6 hours after discharge from hospital. . Pain not relieved by pain medication. . Fever of 100.4 F or greater. . Unusual vaginal discharge or odor.   Call for an appointment:   Do not take Ibuprofen until 2 p.m.

## 2021-04-15 NOTE — Progress Notes (Signed)
No updates to above H&P. Patient arrived NPO and was consented in PACU. Risks again discussed, all questions answered, and consent signed. Proceed with above surgery.    Sahmya Arai MD  

## 2021-04-15 NOTE — Op Note (Signed)
PREOPERATIVE DIAGNOSES: 1. Postmenopausal bleeding 2. POlyp  POSTOPERATIVE DIAGNOSES: Same  PROCEDURE PERFORMED: Dilation, curretage, polypectomy, myomectomy, hysteroscopy   SURGEON: Dr. Lucillie Garfinkel  ANESTHESIA: general  ESTIMATED BLOOD LOSS: 5cc.  COMPLICATIONS: None  TUBES: None.  DRAINS: None  PATHOLOGY: Endometrial curretings and polyp and portions of submucosal fibroid  FINDINGS: On exam, under anesthesia, normal appearing vulva and vagina, 9 week sized uterus  Operative findings demonstrated two polyps, a small submucosal looking fibroid and otherwise atrophic endometrium. B/l ostia visualized  Procedure: The patient was taken to the operating room where she was properly prepped and draped in sterile manner under general anesthesia. After bimanual examination, the cervix was exposed with a weighted vaginal speculum and the anterior lip of the cervix grasped with a tenaculum.  The endocervical canal was then progressively dilated to 67mm. The hysteroscope was then introduced into the uterine cavity using sterile saline solution as a distending media and with attached video camera. The endometrial cavity was distended with fluids and the cavity with the b/l ostia were visualized. Above findings noted. Myosure was used in the usual fashion to removal both boths and the fibroid.  Sharp curretage was then performed. The scope was reintroduced and several pictures were taken of the endometrial cavity and the hysteroscope removed from the cavity. The sponge and lap counts were correct times 2 at this time. The patient's procedure was terminated. We then awakened her. She was sent to the Recovery Room in good condition.    Lucillie Garfinkel MD

## 2021-04-15 NOTE — Anesthesia Preprocedure Evaluation (Signed)
Anesthesia Evaluation  Patient identified by MRN, date of birth, ID band Patient awake    Reviewed: Allergy & Precautions, NPO status , Patient's Chart, lab work & pertinent test results  Airway Mallampati: II  TM Distance: >3 FB Neck ROM: Full    Dental no notable dental hx.    Pulmonary neg pulmonary ROS,    Pulmonary exam normal breath sounds clear to auscultation (-) decreased breath sounds      Cardiovascular negative cardio ROS Normal cardiovascular exam Rhythm:Regular Rate:Normal     Neuro/Psych negative neurological ROS  negative psych ROS   GI/Hepatic negative GI ROS, Neg liver ROS,   Endo/Other  Hypothyroidism Morbid obesityPCOS  Renal/GU negative Renal ROS  negative genitourinary   Musculoskeletal negative musculoskeletal ROS (+)   Abdominal (+) + obese,   Peds negative pediatric ROS (+)  Hematology negative hematology ROS (+)   Anesthesia Other Findings   Reproductive/Obstetrics negative OB ROS                             Anesthesia Physical Anesthesia Plan  ASA: II  Anesthesia Plan: General   Post-op Pain Management:    Induction: Intravenous  PONV Risk Score and Plan: 3 and Ondansetron, Dexamethasone and Treatment may vary due to age or medical condition  Airway Management Planned: LMA  Additional Equipment:   Intra-op Plan:   Post-operative Plan: Extubation in OR  Informed Consent: I have reviewed the patients History and Physical, chart, labs and discussed the procedure including the risks, benefits and alternatives for the proposed anesthesia with the patient or authorized representative who has indicated his/her understanding and acceptance.     Dental advisory given  Plan Discussed with: CRNA and Surgeon  Anesthesia Plan Comments:         Anesthesia Quick Evaluation

## 2021-04-15 NOTE — Anesthesia Postprocedure Evaluation (Signed)
Anesthesia Post Note  Patient: Chanita A Faw  Procedure(s) Performed: DILATATION & CURETTAGE/HYSTEROSCOPY WITH POSSIBLE MYOSURE (N/A Uterus)     Patient location during evaluation: PACU Anesthesia Type: General Level of consciousness: awake and alert Pain management: pain level controlled Vital Signs Assessment: post-procedure vital signs reviewed and stable Respiratory status: spontaneous breathing, nonlabored ventilation, respiratory function stable and patient connected to nasal cannula oxygen Cardiovascular status: blood pressure returned to baseline and stable Postop Assessment: no apparent nausea or vomiting Anesthetic complications: no   No complications documented.  Last Vitals:  Vitals:   04/15/21 0845 04/15/21 0900  BP: 128/69 114/77  Pulse: 73 71  Resp: 12 14  Temp: (!) 36.3 C (!) 36.3 C  SpO2: 94% 96%    Last Pain:  Vitals:   04/15/21 0900  TempSrc:   PainSc: 0-No pain                 Sariyah Corcino S

## 2021-04-19 LAB — SURGICAL PATHOLOGY

## 2021-04-20 ENCOUNTER — Encounter (HOSPITAL_BASED_OUTPATIENT_CLINIC_OR_DEPARTMENT_OTHER): Payer: Self-pay | Admitting: Obstetrics and Gynecology

## 2021-05-12 ENCOUNTER — Other Ambulatory Visit: Payer: Self-pay

## 2021-05-12 ENCOUNTER — Ambulatory Visit (AMBULATORY_SURGERY_CENTER): Payer: BC Managed Care – PPO | Admitting: *Deleted

## 2021-05-12 VITALS — Ht 69.25 in | Wt 320.0 lb

## 2021-05-12 DIAGNOSIS — Z1211 Encounter for screening for malignant neoplasm of colon: Secondary | ICD-10-CM

## 2021-05-12 NOTE — Progress Notes (Signed)
Patient's pre-visit was done today over the phone with the patient due to COVID-19 pandemic. Name,DOB and address verified. Insurance verified. Patient denies any allergies to Eggs and Soy. Patient denies any problems with anesthesia/sedation. Patient denies taking diet pills or blood thinners. Packet of Prep instructions mailed to patient including a copy of a consent form-pt is aware. Patient understands to call us back with any questions or concerns. Patient is aware of our care-partner policy and [id number] safety protocol. The patient is COVID-19 vaccinated, per patient. Pt denies any medical changes since last GI PV except she had sx D&C 04/15/2021-pt has no further follow up w/surgeon. Pt has clenpiq at home.

## 2021-05-26 ENCOUNTER — Ambulatory Visit (AMBULATORY_SURGERY_CENTER): Payer: BC Managed Care – PPO | Admitting: Gastroenterology

## 2021-05-26 ENCOUNTER — Encounter: Payer: Self-pay | Admitting: Gastroenterology

## 2021-05-26 ENCOUNTER — Other Ambulatory Visit: Payer: Self-pay

## 2021-05-26 VITALS — BP 135/78 | HR 70 | Temp 98.0°F | Resp 15 | Ht 69.25 in | Wt 320.0 lb

## 2021-05-26 DIAGNOSIS — D124 Benign neoplasm of descending colon: Secondary | ICD-10-CM

## 2021-05-26 DIAGNOSIS — Z1211 Encounter for screening for malignant neoplasm of colon: Secondary | ICD-10-CM | POA: Diagnosis present

## 2021-05-26 DIAGNOSIS — D126 Benign neoplasm of colon, unspecified: Secondary | ICD-10-CM

## 2021-05-26 DIAGNOSIS — D120 Benign neoplasm of cecum: Secondary | ICD-10-CM | POA: Diagnosis not present

## 2021-05-26 DIAGNOSIS — D123 Benign neoplasm of transverse colon: Secondary | ICD-10-CM

## 2021-05-26 DIAGNOSIS — K635 Polyp of colon: Secondary | ICD-10-CM

## 2021-05-26 MED ORDER — SODIUM CHLORIDE 0.9 % IV SOLN: 500.0000 mL | Freq: Once | INTRAVENOUS | Status: AC

## 2021-05-26 NOTE — Progress Notes (Signed)
Called to room to assist during endoscopic procedure.  Patient ID and intended procedure confirmed with present staff. Received instructions for my participation in the procedure from the performing physician.  

## 2021-05-26 NOTE — Progress Notes (Signed)
Pt's states no medical or surgical changes since previsit or office visit.  ° °Vitals CW °

## 2021-05-26 NOTE — Op Note (Signed)
[facility] Patient Name: Linda Marsh Procedure Date: 05/26/2021 9:32 AM MRN: [id number] Endoscopist: Thornton Park MD, MD Age: 50 Referring MD:  Date of Birth: 1971/08/08 Gender: Female Account #: [id number] Procedure:                Colonoscopy Indications:              Screening for colorectal malignant neoplasm                           Colonoscopy in 2008 for evaluation of change in                            bowel habits, diagnosed with IBS Medicines:                Monitored Anesthesia Care Procedure:                Pre-Anesthesia Assessment:                           - Prior to the procedure, a History and Physical                            was performed, and patient medications and                            allergies were reviewed. The patient's tolerance of                            previous anesthesia was also reviewed. The risks                            and benefits of the procedure and the sedation                            options and risks were discussed with the patient.                            All questions were answered, and informed consent                            was obtained. Prior Anticoagulants: The patient has                            taken no previous anticoagulant or antiplatelet                            agents. ASA Grade Assessment: II - A patient with                            mild systemic disease. After reviewing the risks                            and benefits, the patient was deemed in  satisfactory condition to undergo the procedure.                           After obtaining informed consent, the colonoscope                            was passed under direct vision. Throughout the                            procedure, the patient's blood pressure, pulse, and                            oxygen saturations were monitored continuously. The                            CF [id number] #[id number] was  introduced through the anus                            and advanced to the 3 cm into the ileum. A second                            forward view of the right colon was performed. The                            colonoscopy was performed with moderate difficulty                            due to a tortuous colon. Successful completion of                            the procedure was aided by applying abdominal                            pressure. The patient tolerated the procedure well.                            The quality of the bowel preparation was good. The                            terminal ileum, ileocecal valve, appendiceal                            orifice, and rectum were photographed. Scope In: 9:39:36 AM Scope Out: 9:58:58 AM Scope Withdrawal Time: 0 hours 16 minutes 1 second  Total Procedure Duration: 0 hours 19 minutes 22 seconds  Findings:                 Hemorrhoids and a skin tag were found on perianal                            exam.                           A 3 mm polyp was found in  the descending colon. The                            polyp was sessile. The polyp was removed with a                            cold snare. Resection and retrieval were complete.                            Estimated blood loss was minimal.                           Two polyps were found in the splenic flexure. The                            22mm sessile polyp and the 26mm semi-pedunculated                            polyp were removed with a cold snare. Resection and                            retrieval were complete. Estimated blood loss was                            minimal.                           A 4 mm polyp was found in the distal transverse                            colon. The polyp was semi-pedunculated. The polyp                            was removed with a cold snare. Resection and                            retrieval were complete. Estimated blood loss was                             minimal.                           Two sessile polyps were found in the cecum. The                            polyps were 2 to 4 mm in size. These polyps were                            removed with a cold snare. Resection and retrieval                            were complete. Estimated blood loss was minimal.  Multiple small and large-mouthed diverticula were                            found in the sigmoid colon and descending colon.                           The exam was otherwise without abnormality on                            direct and retroflexion views. The colon is                            tortuous. There was some minimal residual stool. Complications:            No immediate complications. Estimated blood loss:                            Minimal. Estimated Blood Loss:     Estimated blood loss was minimal. Impression:               - Hemorrhoids found on perianal exam.                           - One 3 mm polyp in the descending colon, removed                            with a cold snare. Resected and retrieved.                           - Two 4 to 8 mm polyps at the splenic flexure,                            removed with a cold snare. Resected and retrieved.                           - One 4 mm polyp in the distal transverse colon,                            removed with a cold snare. Resected and retrieved.                           - Two 2 to 4 mm polyps in the cecum, removed with a                            cold snare. Resected and retrieved.                           - Diverticulosis in the sigmoid colon and in the                            descending colon.                           - The examination was otherwise  normal on direct                            and retroflexion views. Recommendation:           - Patient has a contact number available for                            emergencies. The signs and symptoms of potential                             delayed complications were discussed with the                            patient. Return to normal activities tomorrow.                            Written discharge instructions were provided to the                            patient.                           - High fiber diet.                           - Continue present medications.                           - Await pathology results.                           - Repeat colonoscopy date to be determined after                            pending pathology results are reviewed for                            surveillance. Consider a different bowel prep in                            the future.                           - Emerging evidence supports eating a diet of                            fruits, vegetables, grains, calcium, and yogurt                            while reducing red meat and alcohol may reduce the                            risk of colon cancer.                           - Given these results, all  first degree relatives                            (brothers, sisters, children, parents) should start                            colon cancer screening at age 78.                           - Thank you for allowing me to be involved in your                            colon cancer prevention. Thornton Park MD, MD 05/26/2021 10:08:12 AM This report has been signed electronically.

## 2021-05-26 NOTE — Progress Notes (Signed)
pt tolerated well. VSS. awake and to recovery. Report given to RN.  

## 2021-05-26 NOTE — Patient Instructions (Signed)
High fiber diet, Awaiting pathology results. Repeat colonoscopy date to be determined after pathology results are reviewed for surveillance.  YOU HAD AN ENDOSCOPIC PROCEDURE TODAY AT [facility]:   Refer to the procedure report that was given to you for any specific questions about what was found during the examination.  If the procedure report does not answer your questions, please call your gastroenterologist to clarify.  If you requested that your care partner not be given the details of your procedure findings, then the procedure report has been included in a sealed envelope for you to review at your convenience later.  YOU SHOULD EXPECT: Some feelings of bloating in the abdomen. Passage of more gas than usual.  Walking can help get rid of the air that was put into your GI tract during the procedure and reduce the bloating. If you had a lower endoscopy (such as a colonoscopy or flexible sigmoidoscopy) you may notice spotting of blood in your stool or on the toilet paper. If you underwent a bowel prep for your procedure, you may not have a normal bowel movement for a few days.  Please Note:  You might notice some irritation and congestion in your nose or some drainage.  This is from the oxygen used during your procedure.  There is no need for concern and it should clear up in a day or so.  SYMPTOMS TO REPORT IMMEDIATELY:  Following lower endoscopy (colonoscopy or flexible sigmoidoscopy):  Excessive amounts of blood in the stool  Significant tenderness or worsening of abdominal pains  Swelling of the abdomen that is new, acute  Fever of 100F or higher  For urgent or emergent issues, a gastroenterologist can be reached at any hour by calling [phone]. Do not use MyChart messaging for urgent concerns.    DIET:  We do recommend a small meal at first, but then you may proceed to your regular diet.  Drink plenty of fluids but you should avoid alcoholic beverages for 24  hours.  ACTIVITY:  You should plan to take it easy for the rest of today and you should NOT DRIVE or use heavy machinery until tomorrow (because of the sedation medicines used during the test).    FOLLOW UP: Our staff will call the number listed on your records 48-72 hours following your procedure to check on you and address any questions or concerns that you may have regarding the information given to you following your procedure. If we do not reach you, we will leave a message.  We will attempt to reach you two times.  During this call, we will ask if you have developed any symptoms of COVID 19. If you develop any symptoms (ie: fever, flu-like symptoms, shortness of breath, cough etc.) before then, please call [phone].  If you test positive for Covid 19 in the 2 weeks post procedure, please call and report this information to [place].    If any biopsies were taken you will be contacted by phone or by letter within the next 1-3 weeks.  Please call us at [phone] if you have not heard about the biopsies in 3 weeks.    SIGNATURES/CONFIDENTIALITY: You and/or your care partner have signed paperwork which will be entered into your electronic medical record.  These signatures attest to the fact that that the information above on your After Visit Summary has been reviewed and is understood.  Full responsibility of the confidentiality of this discharge information lies with you and/or your care-partner.

## 2021-05-27 ENCOUNTER — Encounter (INDEPENDENT_AMBULATORY_CARE_PROVIDER_SITE_OTHER): Payer: Self-pay

## 2021-05-30 ENCOUNTER — Telehealth: Payer: Self-pay

## 2021-05-30 NOTE — Telephone Encounter (Signed)
LVM

## 2021-06-02 ENCOUNTER — Encounter: Payer: Self-pay | Admitting: Gastroenterology

## 2022-12-28 NOTE — Progress Notes (Signed)
Office Visit Note  Patient: Linda Marsh             Date of Birth: 04/02/1971           MRN: [id number]             PCP: Ollen Bowl, MD Referring: Ollen Bowl, MD Visit Date: 01/05/2023 Occupation: @GUAROCC$ @  Subjective:  Positive ANA and joint pain  History of Present Illness: Linda Marsh is a 52 y.o. female in consultation per request of her PCP.  According the patient last year she started experiencing some discomfort in her knee joints.  She had initial labs which showed positive ANA.  In August 2023 she had repeat labs which showed positive ANA.  She also had x-rays of her bilateral knee joints that she was having knee joint discomfort.  Left knee joint x-rays were unremarkable and the right knee joint showed mild osteoarthritic changes.  She states she has off-and-on discomfort in her knee joints mostly when she is kneeling.  She also has some stiffness in her hands.  He denies any lower back pain but she states when she gets massage then some areas on her back are tender.  She gives history of hyperalgesia.  She states she gets muscle spasms which move around.  She has not noticed any joint swelling.  No family history of autoimmune disease.  Gravida 3, para 3.    Activities of Daily Living:  Patient reports morning stiffness for 2 hours.   Patient Reports nocturnal pain.  Difficulty dressing/grooming: Denies Difficulty climbing stairs: Denies Difficulty getting out of chair: Denies Difficulty using hands for taps, buttons, cutlery, and/or writing: Denies  Review of Systems  Constitutional:  Positive for fatigue.  HENT:  Negative for mouth sores and mouth dryness.   Eyes:  Negative for dryness.  Respiratory:  Negative for shortness of breath.   Cardiovascular:  Positive for palpitations. Negative for chest pain.       Occasional, Evaluated in the past.  Gastrointestinal:  Positive for constipation and diarrhea. Negative for blood in stool.  Endocrine:  Negative for increased urination.  Genitourinary:  Negative for involuntary urination.  Musculoskeletal:  Positive for joint pain, gait problem, joint pain, morning stiffness and muscle tenderness. Negative for joint swelling, myalgias, muscle weakness and myalgias.  Skin:  Positive for hair loss. Negative for color change, rash and sensitivity to sunlight.  Allergic/Immunologic: Negative for susceptible to infections.  Neurological:  Negative for dizziness and headaches.  Hematological:  Positive for swollen glands.  Psychiatric/Behavioral:  Positive for sleep disturbance. Negative for depressed mood. The patient is not nervous/anxious.     PMFS History:  Patient Active Problem List   Diagnosis Date Noted   History of hypothyroidism 01/05/2023   History of hyperlipidemia 01/05/2023   PCOS (polycystic ovarian syndrome) 01/05/2023   Anxiety and depression 01/05/2023    Past Medical History:  Diagnosis Date   Allergy    Anxiety    situational    Arthritis    djd lower spine   Blood transfusion without reported diagnosis 2005   placenta [facility]   Hypothyroidism    IBS (irritable bowel syndrome)    PCOS (polycystic ovarian syndrome)    PMB (postmenopausal bleeding)    Uterine polyp    Wears glasses    for reading    Family History  Problem Relation Age of Onset   Melanoma Mother    Dementia Mother    Aneurysm Mother  Colon polyps Father    Polycystic kidney disease Father    Colon cancer Neg Hx    Esophageal cancer Neg Hx    Rectal cancer Neg Hx    Stomach cancer Neg Hx    Past Surgical History:  Procedure Laterality Date   CESAREAN SECTION  1989   x 1   COLONOSCOPY  02/04/2007   DILATATION & CURETTAGE/HYSTEROSCOPY WITH MYOSURE N/A 04/15/2021   Procedure: DILATATION & CURETTAGE/HYSTEROSCOPY WITH POSSIBLE MYOSURE;  Surgeon: Tyson Dense, MD;  Location: [facility];  Service: Gynecology;  Laterality: N/A;   DILATION AND CURETTAGE OF UTERUS  2005    16 week spontaneous abortion    LIPOMA EXCISION Right yrs ago   [place]  30 yrs ago   Social History   Social History Narrative   Not on file    There is no immunization history on file for this patient.   Objective: Vital Signs: BP 117/74 (BP Location: Right Arm, Patient Position: Sitting, Cuff Size: Large)   Pulse 60   Resp 12   Ht 5' 8"$  (1.727 m)   Wt (!) 322 lb (146.1 kg)   LMP 12/11/2016   BMI 48.96 kg/m    Physical Exam Vitals and nursing note reviewed.  Constitutional:      Appearance: She is well-developed.  HENT:     Head: Normocephalic and atraumatic.  Eyes:     Conjunctiva/sclera: Conjunctivae normal.  Cardiovascular:     Rate and Rhythm: Normal rate and regular rhythm.     Heart sounds: Normal heart sounds.  Pulmonary:     Effort: Pulmonary effort is normal.     Breath sounds: Normal breath sounds.  Abdominal:     General: Bowel sounds are normal.     Palpations: Abdomen is soft.  Musculoskeletal:     Cervical back: Normal range of motion.  Lymphadenopathy:     Cervical: No cervical adenopathy.  Skin:    General: Skin is warm and dry.     Capillary Refill: Capillary refill takes less than 2 seconds.  Neurological:     Mental Status: She is alert and oriented to person, place, and time.  Psychiatric:        Behavior: Behavior normal.      Musculoskeletal Exam: Cervical, thoracic and lumbar spine were in good range of motion.  She had mild tenderness in the gluteal region.  Shoulder joints, elbow joints, wrist joints, MCPs PIPs and DIPs were in good range of motion.  She had bilateral PIP and DIP thickening consistent with osteoarthritis.  Hip joints and knee joints were in good range of motion.  Some warmth was noted on palpation of the right knee joint.  Ankle joints were in good range of motion.  She had no tenderness over ankles or MTPs.  No synovitis was noted on the examination.  CDAI Exam: CDAI Score: -- Patient Global: --;  Provider Global: -- Swollen: --; Tender: -- Joint Exam 01/05/2023   No joint exam has been documented for this visit   There is currently no information documented on the homunculus. Go to the Rheumatology activity and complete the homunculus joint exam.  Investigation: No additional findings.  Imaging: No results found.  Recent Labs: No results found for: "WBC", "HGB", "PLT", "NA", "K", "CL", "CO2", "GLUCOSE", "BUN", "CREATININE", "BILITOT", "ALKPHOS", "AST", "ALT", "PROT", "ALBUMIN", "CALCIUM", "GFRAA", "QFTBGOLD", "QFTBGOLDPLUS"  Speciality Comments: No specialty comments available.  Procedures:  No procedures performed Allergies: Latex   Assessment /  Plan:     Visit Diagnoses: Positive ANA (antinuclear antibody) - 06/26/22:ANA 1:320 nuclear dense fine speckled, chromatin<1, dsDNA 4, Ro-, Sm-, La-, RNP-, Scl-70-, AntiJo1-, centromere-, dsDNA 4, RF less than 14, ESR 5, uric acid 6.7, TSH normal, June 2023 CBC and CMP.  Patient has positive ANA.  She has no clinical features of autoimmune disease.  She denies any history of oral ulcers, nasal ulcers, malar rash, photosensitivity, Raynaud's phenomenon or lymphadenopathy.  There is no history of inflammatory arthritis.  She had extensive workup in the past.  ENA was completely negative.  I advised patient to contact me if she develops any of the above symptoms.  Primary osteoarthritis of both hands-she has a stiffness in her hands.  She notices some intermittent swelling which she relates to fluid retention.  Bilateral PIP and DIP thickening was noted which was consistent with osteoarthritis.  A handout on muscle strengthening was given.  Joint protection was discussed.  Chronic pain of both knees-she complains of discomfort in her bilateral knee joints.  Warmth was noted on palpation of the right knee joint.  I also reviewed x-rays of her knee joints on her cell phone at the left knee joint x-rays from May 21, 2022 was reported normal by  the radiologist.  Right knee joint x-rays showed mild degenerative changes per report.  Weight loss diet and exercise was emphasized.  A handout on lower extremity muscle strength exercises was given.  I also offered possible cortisone injection in her right knee joint as she has some warmth and also having discomfort.  Patient declined.  Use of topical Voltaren gel was advised.  I advised her to contact us if her symptoms get worse.  Chronic midline low back pain without sciatica-she states she has off-and-on discomfort in her lower back after prolonged standing and doing dishes.  Discomfort is only occasional.  I gave her a handout on back exercises.  Muscle spasms of both lower extremities-she gives history of episodic muscle spasms in her lower extremities.  Use of magnesium at bedtime was advised.  Hyperalgesia-she gives history of hyperalgesia.  She did not have any tender points.  Stretching exercises and yoga was advised.  She may also benefit from serving in water aerobics.  History of hypothyroidism  History of hyperlipidemia-June 2023 triglycerides 159  PCOS (polycystic ovarian syndrome)  Anxiety and depression  Orders: No orders of the defined types were placed in this encounter.  No orders of the defined types were placed in this encounter.    Follow-Up Instructions: Return for Osteoarthritis, positive ANA.  I advised patient to return if she develops any new symptoms.   Bo Merino, MD  Note - This record has been created using Editor, commissioning.  Chart creation errors have been sought, but may not always  have been located. Such creation errors do not reflect on  the standard of medical care.

## 2023-01-05 ENCOUNTER — Encounter: Payer: Self-pay | Admitting: Rheumatology

## 2023-01-05 ENCOUNTER — Ambulatory Visit: Payer: 59 | Attending: Rheumatology | Admitting: Rheumatology

## 2023-01-05 VITALS — BP 117/74 | HR 60 | Resp 12 | Ht 68.0 in | Wt 322.0 lb

## 2023-01-05 DIAGNOSIS — F419 Anxiety disorder, unspecified: Secondary | ICD-10-CM | POA: Insufficient documentation

## 2023-01-05 DIAGNOSIS — M62838 Other muscle spasm: Secondary | ICD-10-CM

## 2023-01-05 DIAGNOSIS — M25561 Pain in right knee: Secondary | ICD-10-CM | POA: Diagnosis not present

## 2023-01-05 DIAGNOSIS — F32A Depression, unspecified: Secondary | ICD-10-CM

## 2023-01-05 DIAGNOSIS — R208 Other disturbances of skin sensation: Secondary | ICD-10-CM

## 2023-01-05 DIAGNOSIS — M545 Low back pain, unspecified: Secondary | ICD-10-CM | POA: Diagnosis not present

## 2023-01-05 DIAGNOSIS — M19041 Primary osteoarthritis, right hand: Secondary | ICD-10-CM

## 2023-01-05 DIAGNOSIS — M19042 Primary osteoarthritis, left hand: Secondary | ICD-10-CM

## 2023-01-05 DIAGNOSIS — E282 Polycystic ovarian syndrome: Secondary | ICD-10-CM | POA: Insufficient documentation

## 2023-01-05 DIAGNOSIS — Z8639 Personal history of other endocrine, nutritional and metabolic disease: Secondary | ICD-10-CM | POA: Insufficient documentation

## 2023-01-05 DIAGNOSIS — R768 Other specified abnormal immunological findings in serum: Secondary | ICD-10-CM | POA: Diagnosis not present

## 2023-01-05 DIAGNOSIS — G8929 Other chronic pain: Secondary | ICD-10-CM

## 2023-01-05 DIAGNOSIS — M25562 Pain in left knee: Secondary | ICD-10-CM

## 2023-01-05 NOTE — Patient Instructions (Signed)
Hand Exercises Hand exercises can be helpful for almost anyone. These exercises can strengthen the hands, improve flexibility and movement, and increase blood flow to the hands. These results can make work and daily tasks easier. Hand exercises can be especially helpful for people who have joint pain from arthritis or have nerve damage from overuse (carpal tunnel syndrome). These exercises can also help people who have injured a hand. Exercises Most of these hand exercises are gentle stretching and motion exercises. It is usually safe to do them often throughout the day. Warming up your hands before exercise may help to reduce stiffness. You can do this with gentle massage or by placing your hands in warm water for 10-15 minutes. It is normal to feel some stretching, pulling, tightness, or mild discomfort as you begin new exercises. This will gradually improve. Stop an exercise right away if you feel sudden, severe pain or your pain gets worse. Ask your health care provider which exercises are best for you. Knuckle bend or "claw" fist  Stand or sit with your arm, hand, and all five fingers pointed straight up. Make sure to keep your wrist straight during the exercise. Gently bend your fingers down toward your palm until the tips of your fingers are touching the top of your palm. Keep your big knuckle straight and just bend the small knuckles in your fingers. Hold this position for __________ seconds. Straighten (extend) your fingers back to the starting position. Repeat this exercise 5-10 times with each hand. Full finger fist  Stand or sit with your arm, hand, and all five fingers pointed straight up. Make sure to keep your wrist straight during the exercise. Gently bend your fingers into your palm until the tips of your fingers are touching the middle of your palm. Hold this position for __________ seconds. Extend your fingers back to the starting position, stretching every joint fully. Repeat  this exercise 5-10 times with each hand. Straight fist Stand or sit with your arm, hand, and all five fingers pointed straight up. Make sure to keep your wrist straight during the exercise. Gently bend your fingers at the big knuckle, where your fingers meet your hand, and the middle knuckle. Keep the knuckle at the tips of your fingers straight and try to touch the bottom of your palm. Hold this position for __________ seconds. Extend your fingers back to the starting position, stretching every joint fully. Repeat this exercise 5-10 times with each hand. Tabletop  Stand or sit with your arm, hand, and all five fingers pointed straight up. Make sure to keep your wrist straight during the exercise. Gently bend your fingers at the big knuckle, where your fingers meet your hand, as far down as you can while keeping the small knuckles in your fingers straight. Think of forming a tabletop with your fingers. Hold this position for __________ seconds. Extend your fingers back to the starting position, stretching every joint fully. Repeat this exercise 5-10 times with each hand. Finger spread  Place your hand flat on a table with your palm facing down. Make sure your wrist stays straight as you do this exercise. Spread your fingers and thumb apart from each other as far as you can until you feel a gentle stretch. Hold this position for __________ seconds. Bring your fingers and thumb tight together again. Hold this position for __________ seconds. Repeat this exercise 5-10 times with each hand. Making circles  Stand or sit with your arm, hand, and all five fingers pointed   straight up. Make sure to keep your wrist straight during the exercise. Make a circle by touching the tip of your thumb to the tip of your index finger. Hold for __________ seconds. Then open your hand wide. Repeat this motion with your thumb and each finger on your hand. Repeat this exercise 5-10 times with each hand. Thumb  motion  Sit with your forearm resting on a table and your wrist straight. Your thumb should be facing up toward the ceiling. Keep your fingers relaxed as you move your thumb. Lift your thumb up as high as you can toward the ceiling. Hold for __________ seconds. Bend your thumb across your palm as far as you can, reaching the tip of your thumb for the small finger (pinkie) side of your palm. Hold for __________ seconds. Repeat this exercise 5-10 times with each hand. Grip strengthening  Hold a stress ball or other soft ball in the middle of your hand. Slowly increase the pressure, squeezing the ball as much as you can without causing pain. Think of bringing the tips of your fingers into the middle of your palm. All of your finger joints should bend when doing this exercise. Hold your squeeze for __________ seconds, then relax. Repeat this exercise 5-10 times with each hand. Contact a health care provider if: Your hand pain or discomfort gets much worse when you do an exercise. Your hand pain or discomfort does not improve within 2 hours after you exercise. If you have any of these problems, stop doing these exercises right away. Do not do them again unless your health care provider says that you can. Get help right away if: You develop sudden, severe hand pain or swelling. If this happens, stop doing these exercises right away. Do not do them again unless your health care provider says that you can. This information is not intended to replace advice given to you by your health care provider. Make sure you discuss any questions you have with your health care provider. Document Revised: 02/24/2021 Document Reviewed: 02/24/2021 Elsevier Patient Education  [place]. Exercises for Chronic Knee Pain Chronic knee pain is pain that lasts longer than 3 months. For most people with chronic knee pain, exercise and weight loss is an important part of treatment. Your health care provider may want  you to focus on: Strengthening the muscles that support your knee. This can take pressure off your knee and lessen pain. Preventing knee stiffness. Maintaining or increasing how far you can move your knee. Losing weight (if this applies) to take pressure off your knee, decrease your risk for injury, and make it easier for you to exercise. Your health care provider will help you develop an exercise program that matches your needs and physical abilities. Below are simple, low-impact exercises you can do at home. Ask your health care provider or a physical therapist how often you should do your exercise program and how many times to repeat each exercise. General safety tips Follow these safety tips for exercising with chronic knee pain: Get your health care provider's approval before doing any exercises. Start slowly and stop any time an exercise causes pain. Do not exercise if your knee pain is flaring up. Warm up first. Stretching a cold muscle can cause an injury. Do 5-10 minutes of easy movement or light stretching before beginning your exercise routine. Do 5-10 minutes of low-impact activity (like walking or cycling) before starting strengthening exercises. Contact your health care provider any time you have pain  during or after exercising. Exercise may cause discomfort but should not be painful. It is normal to be a little stiff or sore after exercising.  Stretching and range-of-motion exercises Front thigh stretch  Stand up straight and support your body by holding on to a chair or resting one hand on a wall. With your legs straight and close together, bend one knee to lift your heel up toward your buttocks. Using one hand for support, grab your ankle with your free hand. Pull your foot up closer toward your buttocks to feel the stretch in front of your thigh. Hold the stretch for 30 seconds. Repeat __________ times. Complete this exercise __________ times a day. Back thigh stretch  Sit  on the floor with your back straight and your legs out straight in front of you. Place the palms of your hands on the floor and slide them toward your feet as you bend at the hip. Try to touch your nose to your knees and feel the stretch in the back of your thighs. Hold for 30 seconds. Repeat __________ times. Complete this exercise __________ times a day. Calf stretch  Stand facing a wall. Place the palms of your hands flat against the wall, arms extended, and lean slightly against the wall. Get into a lunge position with one leg bent at the knee and the other leg stretched out straight behind you. Keep both feet facing the wall and increase the bend in your knee while keeping the heel of the other leg flat on the ground. You should feel the stretch in your calf. Hold for 30 seconds. Repeat __________ times. Complete this exercise __________ times a day. Strengthening exercises Straight leg lift Lie on your back with one knee bent and the other leg out straight. Slowly lift the straight leg without bending the knee. Lift until your foot is about 12 inches (30 cm) off the floor. Hold for 3-5 seconds and slowly lower your leg. Repeat __________ times. Complete this exercise __________ times a day. Single leg dip Stand between two chairs and put both hands on the backs of the chairs for support. Extend one leg out straight with your body weight resting on the heel of the standing leg. Slowly bend your standing knee to dip your body to the level that is comfortable for you. Hold for 3-5 seconds. Repeat __________ times. Complete this exercise __________ times a day. Hamstring curls Stand straight, knees close together, facing the back of a chair. Hold on to the back of a chair with both hands. Keep one leg straight. Bend the other knee while bringing the heel up toward the buttock until the knee is bent at a 90-degree angle (right angle). Hold for 3-5 seconds. Repeat __________ times.  Complete this exercise __________ times a day. Wall squat Stand straight with your back, hips, and head against a wall. Step forward one foot at a time with your back still against the wall. Your feet should be 2 feet (61 cm) from the wall at shoulder width. Keeping your back, hips, and head against the wall, slide down the wall to as close of a sitting position as you can get. Hold for 5-10 seconds, then slowly slide back up. Repeat __________ times. Complete this exercise __________ times a day. Step-ups Step up with one foot onto a sturdy platform or stool that is about 6 inches (15 cm) high. Face sideways with one foot on the platform and one on the ground. Place all your weight on  the platform foot and lift your body off the ground until your knee extends. Let your other leg hang free to the side. Hold for 3-5 seconds then slowly lower your weight down to the floor foot. Repeat __________ times. Complete this exercise __________ times a day. Contact a health care provider if: Your exercise causes pain. Your pain is worse after you exercise. Your pain prevents you from doing your exercises. This information is not intended to replace advice given to you by your health care provider. Make sure you discuss any questions you have with your health care provider. Document Revised: 03/11/2020 Document Reviewed: 11/03/2019 Elsevier Patient Education  [place].  Back Exercises The following exercises strengthen the muscles that help to support the trunk (torso) and back. They also help to keep the lower back flexible. Doing these exercises can help to prevent or lessen existing low back pain. If you have back pain or discomfort, try doing these exercises 2-3 times each day or as told by your health care provider. As your pain improves, do them once each day, but increase the number of times that you repeat the steps for each exercise (do more repetitions). To prevent the recurrence of  back pain, continue to do these exercises once each day or as told by your health care provider. Do exercises exactly as told by your health care provider and adjust them as directed. It is normal to feel mild stretching, pulling, tightness, or discomfort as you do these exercises, but you should stop right away if you feel sudden pain or your pain gets worse. Exercises Single knee to chest Repeat these steps 3-5 times for each leg: Lie on your back on a firm bed or the floor with your legs extended. Bring one knee to your chest. Your other leg should stay extended and in contact with the floor. Hold your knee in place by grabbing your knee or thigh with both hands and hold. Pull on your knee until you feel a gentle stretch in your lower back or buttocks. Hold the stretch for 10-30 seconds. Slowly release and straighten your leg.  Pelvic tilt Repeat these steps 5-10 times: Lie on your back on a firm bed or the floor with your legs extended. Bend your knees so they are pointing toward the ceiling and your feet are flat on the floor. Tighten your lower abdominal muscles to press your lower back against the floor. This motion will tilt your pelvis so your tailbone points up toward the ceiling instead of pointing to your feet or the floor. With gentle tension and even breathing, hold this position for 5-10 seconds.  Cat-cow Repeat these steps until your lower back becomes more flexible: Get into a hands-and-knees position on a firm bed or the floor. Keep your hands under your shoulders, and keep your knees under your hips. You may place padding under your knees for comfort. Let your head hang down toward your chest. Contract your abdominal muscles and point your tailbone toward the floor so your lower back becomes rounded like the back of a cat. Hold this position for 5 seconds. Slowly lift your head, let your abdominal muscles relax, and point your tailbone up toward the ceiling so your back  forms a sagging arch like the back of a cow. Hold this position for 5 seconds.  Press-ups Repeat these steps 5-10 times: Lie on your abdomen (face-down) on a firm bed or the floor. Place your palms near your head, about shoulder-width  apart. Keeping your back as relaxed as possible and keeping your hips on the floor, slowly straighten your arms to raise the top half of your body and lift your shoulders. Do not use your back muscles to raise your upper torso. You may adjust the placement of your hands to make yourself more comfortable. Hold this position for 5 seconds while you keep your back relaxed. Slowly return to lying flat on the floor.  Bridges Repeat these steps 10 times: Lie on your back on a firm bed or the floor. Bend your knees so they are pointing toward the ceiling and your feet are flat on the floor. Your arms should be flat at your sides, next to your body. Tighten your buttocks muscles and lift your buttocks off the floor until your waist is at almost the same height as your knees. You should feel the muscles working in your buttocks and the back of your thighs. If you do not feel these muscles, slide your feet 1-2 inches (2.5-5 cm) farther away from your buttocks. Hold this position for 3-5 seconds. Slowly lower your hips to the starting position, and allow your buttocks muscles to relax completely. If this exercise is too easy, try doing it with your arms crossed over your chest. Abdominal crunches Repeat these steps 5-10 times: Lie on your back on a firm bed or the floor with your legs extended. Bend your knees so they are pointing toward the ceiling and your feet are flat on the floor. Cross your arms over your chest. Tip your chin slightly toward your chest without bending your neck. Tighten your abdominal muscles and slowly raise your torso high enough to lift your shoulder blades a tiny bit off the floor. Avoid raising your torso higher than that because it can put too  much stress on your lower back and does not help to strengthen your abdominal muscles. Slowly return to your starting position.  Back lifts Repeat these steps 5-10 times: Lie on your abdomen (face-down) with your arms at your sides, and rest your forehead on the floor. Tighten the muscles in your legs and your buttocks. Slowly lift your chest off the floor while you keep your hips pressed to the floor. Keep the back of your head in line with the curve in your back. Your eyes should be looking at the floor. Hold this position for 3-5 seconds. Slowly return to your starting position.  Contact a health care provider if: Your back pain or discomfort gets much worse when you do an exercise. Your worsening back pain or discomfort does not lessen within 2 hours after you exercise. If you have any of these problems, stop doing these exercises right away. Do not do them again unless your health care provider says that you can. Get help right away if: You develop sudden, severe back pain. If this happens, stop doing the exercises right away. Do not do them again unless your health care provider says that you can. This information is not intended to replace advice given to you by your health care provider. Make sure you discuss any questions you have with your health care provider. Document Revised: 05/03/2021 Document Reviewed: 01/19/2021 Elsevier Patient Education  [address].

## 2023-02-09 ENCOUNTER — Ambulatory Visit: Payer: BC Managed Care – PPO | Admitting: Rheumatology

## 2023-03-02 ENCOUNTER — Ambulatory Visit: Payer: BC Managed Care – PPO | Admitting: Rheumatology

## 2024-05-02 ENCOUNTER — Other Ambulatory Visit: Payer: Self-pay | Admitting: Family

## 2024-05-02 DIAGNOSIS — N6452 Nipple discharge: Secondary | ICD-10-CM

## 2024-05-26 ENCOUNTER — Ambulatory Visit
Admission: RE | Admit: 2024-05-26 | Discharge: 2024-05-26 | Disposition: A | Discharge status: Home / Self Care | Source: Ambulatory Visit | Attending: Family | Admitting: Family

## 2024-05-26 DIAGNOSIS — N6452 Nipple discharge: Secondary | ICD-10-CM

## 2024-09-15 ENCOUNTER — Other Ambulatory Visit: Payer: Self-pay | Admitting: Obstetrics and Gynecology

## 2024-09-15 DIAGNOSIS — N6452 Nipple discharge: Secondary | ICD-10-CM

## 2024-10-09 ENCOUNTER — Ambulatory Visit (AMBULATORY_SURGERY_CENTER)

## 2024-10-09 VITALS — Ht 68.0 in | Wt 321.0 lb

## 2024-10-09 DIAGNOSIS — Z860100 Personal history of colon polyps, unspecified: Secondary | ICD-10-CM

## 2024-10-09 MED ORDER — CLENPIQ 10-3.5-12 MG-GM -GM/160ML PO SOLN: 2.0000 | ORAL | 0 refills | Status: DC

## 2024-10-09 NOTE — Progress Notes (Signed)
 No egg or soy allergy known to patient  No issues known to pt with past sedation with any surgeries or procedures Patient denies ever being told they had issues or difficulty with intubation  No FH of Malignant Hyperthermia Pt is not on diet pills Pt is not on  home 02  Pt is not on blood thinners  Pt denies issues with constipation  No A fib or A flutter Have any cardiac testing pending--NO Pt can ambulate independently Pt denies use of chewing tobacco Discussed diabetic I weight loss medication holds Discussed NSAID holds Checked BMI Pt instructed to use Single[employer] or GoodRx for a price reduction on prep  Patient's chart reviewed by Linda Marsh CNRA prior to previsit and patient appropriate for the [facility].  Pre visit completed and red dot placed by patient's name on their procedure day (on provider's schedule).

## 2024-10-19 ENCOUNTER — Ambulatory Visit
Admission: RE | Admit: 2024-10-19 | Discharge: 2024-10-19 | Disposition: A | Source: Ambulatory Visit | Attending: Obstetrics and Gynecology

## 2024-10-19 DIAGNOSIS — N6452 Nipple discharge: Secondary | ICD-10-CM

## 2024-10-19 MED ORDER — GADOBUTROL 1 MMOL/ML IV SOLN
10.0000 mL | Freq: Once | INTRAVENOUS | Status: AC | PRN
  Administered 2024-10-19: 10 mL via INTRAVENOUS

## 2024-10-22 ENCOUNTER — Encounter: Payer: Self-pay | Admitting: Pediatrics

## 2024-10-28 NOTE — Progress Notes (Unsigned)
 [facility] History and Physical   Primary Care Physician:  Hollis Alan ORN, MD   Reason for Procedure:  History of adenomatous colon polyps  Plan:    Colonoscopy   The patient was provided an opportunity to ask questions and all were answered. The patient agreed with the plan.   HPI: Linda Marsh is a 53 y.o. female undergoing colonoscopy for history of adenomatous colon polyps.  Patient's last colonoscopy was performed 2022 and disclosed 6 tubular adenomas.  Colonoscopy prior to that was performed in 2008 for IBS type symptoms and was unremarkable.  No family history of colorectal cancer.  Father had colon polyps.   Past Medical History:  Diagnosis Date   Allergy    Anxiety    situational    Arthritis    djd lower spine   Blood transfusion without reported diagnosis 2005   placenta [facility]   Hypothyroidism    IBS (irritable bowel syndrome)    PCOS (polycystic ovarian syndrome)    PMB (postmenopausal bleeding)    Uterine polyp    Wears glasses    for reading    Past Surgical History:  Procedure Laterality Date   CESAREAN SECTION  1989   x 1   COLONOSCOPY  02/04/2007   DILATATION & CURETTAGE/HYSTEROSCOPY WITH MYOSURE N/A 04/15/2021   Procedure: DILATATION & CURETTAGE/HYSTEROSCOPY WITH POSSIBLE MYOSURE;  Surgeon: Marne Kelly Nest, MD;  Location: [facility];  Service: Gynecology;  Laterality: N/A;   DILATION AND CURETTAGE OF UTERUS  2005   16 week spontaneous abortion    LIPOMA EXCISION Right yrs ago   WISDOM TOOTH EXTRACTION  30 yrs ago    Prior to Admission medications   Medication Sig Start Date End Date Taking? Authorizing Provider  acetaminophen (TYLENOL) 500 MG tablet Take 500 mg by mouth every 6 (six) hours as needed.    [provider]  buPROPion (WELLBUTRIN XL) 150 MG 24 hr tablet Take 150 mg by mouth every morning. Patient not taking: Reported on 10/09/2024 11/10/20   [provider]  Cholecalciferol  50 MCG (2000 UT) CAPS Take by mouth. Vita d    [provider]  DULoxetine (CYMBALTA) 60 MG capsule Take 60 mg by mouth daily. 02/11/24   [provider]  famciclovir (FAMVIR) 500 MG tablet as needed. 04/04/12   [provider]  hydrocortisone 2.5 % ointment Apply topically 2 (two) times daily as needed. 09/24/20   [provider]  levothyroxine (SYNTHROID) 125 MCG tablet 137 mcg daily.    [provider]  magnesium gluconate (MAGONATE) 500 (27 Mg) MG TABS tablet Take 500 mg by mouth in the morning and at bedtime.    [provider]  Sod Picosulfate-Mag Ox-Cit Acd (CLENPIQ ) 10-3.5-12 MG-GM -GM/160ML SOLN Take 2 Bottles by mouth as directed. 10/09/24   Suzann Inocente HERO, MD  tiZANidine (ZANAFLEX) 4 MG tablet Take by mouth. 10/25/22   [provider]  tretinoin (RETIN-A) 0.025 % cream APPLY CREAM TOPICALLY IN THE EVENING TO FACE Patient not taking: Reported on 10/09/2024    [provider]  Vitamins-Lipotropics (B COMPLEX FORMULA 1, LIPOTROP,) TABS Take 1 tablet by mouth daily. Patient not taking: Reported on 10/09/2024 06/20/20   [provider]    Current Outpatient Medications  Medication Sig Dispense Refill   acetaminophen (TYLENOL) 500 MG tablet Take 500 mg by mouth every 6 (six) hours as needed.     buPROPion (WELLBUTRIN XL) 150 MG 24 hr tablet Take 150 mg  by mouth every morning. (Patient not taking: Reported on 10/09/2024)     Cholecalciferol 50 MCG (2000 UT) CAPS Take by mouth. Vita d     DULoxetine (CYMBALTA) 60 MG capsule Take 60 mg by mouth daily.     famciclovir (FAMVIR) 500 MG tablet as needed.     hydrocortisone 2.5 % ointment Apply topically 2 (two) times daily as needed.     levothyroxine (SYNTHROID) 125 MCG tablet 137 mcg daily.     magnesium gluconate (MAGONATE) 500 (27 Mg) MG TABS tablet Take 500 mg by mouth in the morning and at bedtime.     Sod Picosulfate-Mag Ox-Cit Acd (CLENPIQ ) 10-3.5-12 MG-GM  -GM/160ML SOLN Take 2 Bottles by mouth as directed. 320 mL 0   tiZANidine (ZANAFLEX) 4 MG tablet Take by mouth.     tretinoin (RETIN-A) 0.025 % cream APPLY CREAM TOPICALLY IN THE EVENING TO FACE (Patient not taking: Reported on 10/09/2024)     Vitamins-Lipotropics (B COMPLEX FORMULA 1, LIPOTROP,) TABS Take 1 tablet by mouth daily. (Patient not taking: Reported on 10/09/2024)     Current Facility-Administered Medications  Medication Dose Route Frequency Provider Last Rate Last Admin   0.9 %  sodium chloride  infusion  500 mL Intravenous Once Eda Iha, MD        Allergies as of 10/30/2024 - Review Complete 10/09/2024  Allergen Reaction Noted   Latex Itching 10/08/2020    Family History  Problem Relation Age of Onset   Melanoma Mother    Dementia Mother    Aneurysm Mother    Colon polyps Father    Polycystic kidney disease Father    Colon cancer Neg Hx    Esophageal cancer Neg Hx    Rectal cancer Neg Hx    Stomach cancer Neg Hx     Social History   Socioeconomic History   Marital status: Married    Spouse name: Not on file   Number of children: Not on file   Years of education: Not on file   Highest education level: Not on file  Occupational History   Not on file  Tobacco Use   Smoking status: Never   Smokeless tobacco: Never  Vaping Use   Vaping status: Never Used  Substance and Sexual Activity   Alcohol use: Not Currently    Comment: rare- 1 x a month    Drug use: Never   Sexual activity: Not on file  Other Topics Concern   Not on file  Social History Narrative   Not on file   Social Drivers of Health   Financial Resource Strain: Not on file  Food Insecurity: Not on file  Transportation Needs: Not on file  Physical Activity: Not on file  Stress: Not on file  Social Connections: Not on file  Intimate Partner Violence: Not on file    Review of Systems:  All other review of systems negative except as mentioned in the HPI.  Physical Exam: Vital  signs LMP 12/11/2016   General:   Alert,  Well-developed, well-nourished, pleasant and cooperative in NAD Airway:  Mallampati  Lungs:  Clear throughout to auscultation.   Heart:  Regular rate and rhythm; no murmurs, clicks, rubs,  or gallops. Abdomen:  Soft, nontender and nondistended. Normal bowel sounds.   Neuro/Psych:  Normal mood and affect. A and O x 3  Inocente Hausen, MD [facility]

## 2024-10-30 ENCOUNTER — Encounter: Payer: Self-pay | Admitting: Pediatrics

## 2024-10-30 ENCOUNTER — Ambulatory Visit: Admitting: Pediatrics

## 2024-10-30 VITALS — BP 126/82 | HR 75 | Temp 98.6°F | Resp 13 | Ht 68.0 in | Wt 319.0 lb

## 2024-10-30 DIAGNOSIS — Z1211 Encounter for screening for malignant neoplasm of colon: Secondary | ICD-10-CM | POA: Diagnosis present

## 2024-10-30 DIAGNOSIS — K5730 Diverticulosis of large intestine without perforation or abscess without bleeding: Secondary | ICD-10-CM

## 2024-10-30 DIAGNOSIS — D120 Benign neoplasm of cecum: Secondary | ICD-10-CM | POA: Diagnosis not present

## 2024-10-30 DIAGNOSIS — D124 Benign neoplasm of descending colon: Secondary | ICD-10-CM

## 2024-10-30 DIAGNOSIS — D123 Benign neoplasm of transverse colon: Secondary | ICD-10-CM

## 2024-10-30 DIAGNOSIS — D122 Benign neoplasm of ascending colon: Secondary | ICD-10-CM

## 2024-10-30 DIAGNOSIS — K648 Other hemorrhoids: Secondary | ICD-10-CM | POA: Diagnosis not present

## 2024-10-30 DIAGNOSIS — Z860100 Personal history of colon polyps, unspecified: Secondary | ICD-10-CM

## 2024-10-30 DIAGNOSIS — Z83719 Family history of colon polyps, unspecified: Secondary | ICD-10-CM | POA: Diagnosis not present

## 2024-10-30 DIAGNOSIS — Z860101 Personal history of adenomatous and serrated colon polyps: Secondary | ICD-10-CM

## 2024-10-30 HISTORY — DX: Morbid (severe) obesity due to excess calories: E66.01

## 2024-10-30 MED ORDER — SODIUM CHLORIDE 0.9 % IV SOLN: 500.0000 mL | Freq: Once | INTRAVENOUS | Status: DC

## 2024-10-30 NOTE — Progress Notes (Signed)
 Called to room to assist during endoscopic procedure.  Patient ID and intended procedure confirmed with present staff. Received instructions for my participation in the procedure from the performing physician.

## 2024-10-30 NOTE — Progress Notes (Signed)
Pt. states no medical or surgical changes since previsit or office visit. 

## 2024-10-30 NOTE — Patient Instructions (Signed)
-  Handout on polyps, hemorrhoids, diverticulosis provided -await pathology results -repeat colonoscopy for surveillance recommended. Date to be determined when pathology result become available   -Continue present medications   YOU HAD AN ENDOSCOPIC PROCEDURE TODAY AT THE [facility]:   Refer to the procedure report that was given to you for any specific questions about what was found during the examination.  If the procedure report does not answer your questions, please call your gastroenterologist to clarify.  If you requested that your care partner not be given the details of your procedure findings, then the procedure report has been included in a sealed envelope for you to review at your convenience later.  YOU SHOULD EXPECT: Some feelings of bloating in the abdomen. Passage of more gas than usual.  Walking can help get rid of the air that was put into your GI tract during the procedure and reduce the bloating. If you had a lower endoscopy (such as a colonoscopy or flexible sigmoidoscopy) you may notice spotting of blood in your stool or on the toilet paper. If you underwent a bowel prep for your procedure, you may not have a normal bowel movement for a few days.  Please Note:  You might notice some irritation and congestion in your nose or some drainage.  This is from the oxygen used during your procedure.  There is no need for concern and it should clear up in a day or so.  SYMPTOMS TO REPORT IMMEDIATELY:  Following lower endoscopy (colonoscopy or flexible sigmoidoscopy):  Excessive amounts of blood in the stool  Significant tenderness or worsening of abdominal pains  Swelling of the abdomen that is new, acute  Fever of 100F or higher  For urgent or emergent issues, a gastroenterologist can be reached at any hour by calling [phone]. Do not use MyChart messaging for urgent concerns.    DIET:  We do recommend a small meal at first, but then you may proceed to your  regular diet.  Drink plenty of fluids but you should avoid alcoholic beverages for 24 hours.  ACTIVITY:  You should plan to take it easy for the rest of today and you should NOT DRIVE or use heavy machinery until tomorrow (because of the sedation medicines used during the test).    FOLLOW UP: Our staff will call the number listed on your records the next business day following your procedure.  We will call around 7:15- 8:00 am to check on you and address any questions or concerns that you may have regarding the information given to you following your procedure. If we do not reach you, we will leave a message.     If any biopsies were taken you will be contacted by phone or by letter within the next 1-3 weeks.  Please call us at [phone] if you have not heard about the biopsies in 3 weeks.    SIGNATURES/CONFIDENTIALITY: You and/or your care partner have signed paperwork which will be entered into your electronic medical record.  These signatures attest to the fact that that the information above on your After Visit Summary has been reviewed and is understood.  Full responsibility of the confidentiality of this discharge information lies with you and/or your care-partner.

## 2024-10-30 NOTE — Op Note (Signed)
 [facility] Patient Name: Linda Marsh Procedure Date: 10/30/2024 8:54 AM MRN: [id number] Endoscopist: Inocente Hausen , MD, [id number] Age: 53 Referring MD:  Date of Birth: 23-Apr-1971 Gender: Female Account #: [id number] Procedure:                Colonoscopy Indications:              High risk colon cancer surveillance: Personal                            history of multiple (3 or more) adenomas, Last                            colonoscopy: July 2022, Family history of colonic                            polyps in a first-degree relative Medicines:                Monitored Anesthesia Care Procedure:                Pre-Anesthesia Assessment:                           - Prior to the procedure, a History and Physical                            was performed, and patient medications and                            allergies were reviewed. The patient's tolerance of                            previous anesthesia was also reviewed. The risks                            and benefits of the procedure and the sedation                            options and risks were discussed with the patient.                            All questions were answered, and informed consent                            was obtained. Prior Anticoagulants: The patient has                            taken no anticoagulant or antiplatelet agents. ASA                            Grade Assessment: III - A patient with severe                            systemic disease. After reviewing the risks and  benefits, the patient was deemed in satisfactory                            condition to undergo the procedure.                           After obtaining informed consent, the colonoscope                            was passed under direct vision. Throughout the                            procedure, the patient's blood pressure, pulse, and                            oxygen saturations were  monitored continuously. The                            Olympus Scope [id number] was introduced through the                            anus and advanced to the cecum, identified by                            appendiceal orifice and ileocecal valve. The                            colonoscopy was performed without difficulty. An                            abdominal binder was used to facilitate the                            procedure. The patient tolerated the procedure                            well. The quality of the bowel preparation was                            adequate to identify polyps greater than 5 mm in                            size. Scope In: 9:09:31 AM Scope Out: 9:36:03 AM Scope Withdrawal Time: 0 hours 21 minutes 47 seconds  Total Procedure Duration: 0 hours 26 minutes 32 seconds  Findings:                 The perianal and digital rectal examinations were                            normal. Pertinent negatives include normal                            sphincter tone and no palpable rectal lesions.  Multiple large-mouthed and small-mouthed                            diverticula were found in the sigmoid colon and                            descending colon.                           Eight sessile polyps were found in the descending                            colon, transverse colon and cecum. The polyps were                            5 to 7 mm in size. These polyps were removed with a                            cold snare. Resection and retrieval were complete.                           Three sessile polyps were found in the transverse                            colon, ascending colon and cecum. The polyps were 3                            to 4 mm in size. These polyps were removed with a                            cold biopsy forceps. Resection and retrieval were                            complete.                           Internal hemorrhoids  were found during retroflexion. Complications:            No immediate complications. Estimated blood loss:                            Minimal. Estimated Blood Loss:     Estimated blood loss was minimal. Impression:               - Diverticulosis in the sigmoid colon and in the                            descending colon.                           - Eight 5 to 7 mm polyps in the descending colon,                            in the transverse colon and in the cecum, removed  with a cold snare. Resected and retrieved.                           - Three 3 to 4 mm polyps in the transverse colon,                            in the ascending colon and in the cecum, removed                            with a cold biopsy forceps. Resected and retrieved.                           - Internal hemorrhoids. Recommendation:           - Discharge patient to home (ambulatory).                           - Await pathology results.                           - Repeat colonoscopy for surveillance based on                            pathology results.                           - If patient is documented to have a lifetime of                            >=10 adenomatous/pre-cancerous polyps offer                            referral to medical genetics.                           - The findings and recommendations were discussed                            with the patient's family.                           - Patient has a contact number available for                            emergencies. The signs and symptoms of potential                            delayed complications were discussed with the                            patient. Return to normal activities tomorrow.                            Written discharge instructions were provided to the  patient. Inocente Hausen, MD 10/30/2024 9:45:02 AM This report has been signed electronically.

## 2024-10-30 NOTE — Progress Notes (Signed)
 Report given to PACU, vss

## 2024-10-31 ENCOUNTER — Telehealth: Payer: Self-pay

## 2024-10-31 NOTE — Telephone Encounter (Signed)
°  Follow up Call-     10/30/2024    8:43 AM  Call back number  Post procedure Call Back phone  # [phone]  Permission to leave phone message Yes     Patient questions:  Do you have a fever, pain , or abdominal swelling? No. Pain Score  0 *  Have you tolerated food without any problems? Yes.    Have you been able to return to your normal activities? Yes.    Do you have any questions about your discharge instructions: Diet   No. Medications  No. Follow up visit  No.  Do you have questions or concerns about your Care? No.  Actions: * If pain score is 4 or above: No action needed, pain <4.

## 2024-11-03 LAB — SURGICAL PATHOLOGY

## 2024-11-05 ENCOUNTER — Ambulatory Visit: Payer: Self-pay | Admitting: Pediatrics

## 2024-11-06 ENCOUNTER — Other Ambulatory Visit: Payer: Self-pay

## 2024-11-06 DIAGNOSIS — Z860100 Personal history of colon polyps, unspecified: Secondary | ICD-10-CM

## 2024-11-06 DIAGNOSIS — D369 Benign neoplasm, unspecified site: Secondary | ICD-10-CM

## 2024-12-25 ENCOUNTER — Inpatient Hospital Stay: Admitting: Genetic Counselor

## 2024-12-25 ENCOUNTER — Inpatient Hospital Stay

## 2025-01-30 ENCOUNTER — Inpatient Hospital Stay
